# Patient Record
Sex: Male | Born: 1955 | Race: White | Hispanic: No | Smoking: Current every day smoker
Health system: Southern US, Community
[De-identification: ages and names within clinical notes are randomized; demographics above are authoritative.]

## PROBLEM LIST (undated history)

## (undated) DIAGNOSIS — I5020 Unspecified systolic (congestive) heart failure: Secondary | ICD-10-CM

## (undated) DIAGNOSIS — I10 Essential (primary) hypertension: Secondary | ICD-10-CM

## (undated) DIAGNOSIS — F17200 Nicotine dependence, unspecified, uncomplicated: Secondary | ICD-10-CM

## (undated) DIAGNOSIS — I428 Other cardiomyopathies: Secondary | ICD-10-CM

## (undated) DIAGNOSIS — R932 Abnormal findings on diagnostic imaging of liver and biliary tract: Secondary | ICD-10-CM

## (undated) DIAGNOSIS — I272 Pulmonary hypertension, unspecified: Secondary | ICD-10-CM

## (undated) HISTORY — DX: Pulmonary hypertension, unspecified: I27.20

## (undated) HISTORY — DX: Unspecified systolic (congestive) heart failure: I50.20

## (undated) HISTORY — DX: Abnormal findings on diagnostic imaging of liver and biliary tract: R93.2

## (undated) HISTORY — DX: Nicotine dependence, unspecified, uncomplicated: F17.200

## (undated) HISTORY — DX: Other cardiomyopathies: I42.8

---

## 1997-09-17 ENCOUNTER — Inpatient Hospital Stay (HOSPITAL_COMMUNITY): Admission: RE | Admit: 1997-09-17 | Discharge: 1997-09-18 | Payer: Self-pay | Admitting: Neurosurgery

## 2020-09-21 ENCOUNTER — Inpatient Hospital Stay (HOSPITAL_COMMUNITY)
Admission: EM | Admit: 2020-09-21 | Discharge: 2020-09-28 | DRG: 286 | Disposition: A | Discharge status: Home / Self Care | Payer: Self-pay | Attending: Student in an Organized Health Care Education/Training Program | Admitting: Student in an Organized Health Care Education/Training Program

## 2020-09-21 ENCOUNTER — Inpatient Hospital Stay (HOSPITAL_COMMUNITY): Payer: Self-pay

## 2020-09-21 ENCOUNTER — Other Ambulatory Visit: Payer: Self-pay

## 2020-09-21 ENCOUNTER — Encounter (HOSPITAL_COMMUNITY): Payer: Self-pay | Admitting: *Deleted

## 2020-09-21 ENCOUNTER — Emergency Department (HOSPITAL_COMMUNITY): Payer: Self-pay

## 2020-09-21 DIAGNOSIS — Z79899 Other long term (current) drug therapy: Secondary | ICD-10-CM

## 2020-09-21 DIAGNOSIS — I25118 Atherosclerotic heart disease of native coronary artery with other forms of angina pectoris: Secondary | ICD-10-CM | POA: Diagnosis present

## 2020-09-21 DIAGNOSIS — I272 Pulmonary hypertension, unspecified: Secondary | ICD-10-CM

## 2020-09-21 DIAGNOSIS — Z801 Family history of malignant neoplasm of trachea, bronchus and lung: Secondary | ICD-10-CM

## 2020-09-21 DIAGNOSIS — I428 Other cardiomyopathies: Secondary | ICD-10-CM

## 2020-09-21 DIAGNOSIS — F17210 Nicotine dependence, cigarettes, uncomplicated: Secondary | ICD-10-CM | POA: Diagnosis present

## 2020-09-21 DIAGNOSIS — J9601 Acute respiratory failure with hypoxia: Secondary | ICD-10-CM | POA: Diagnosis present

## 2020-09-21 DIAGNOSIS — I110 Hypertensive heart disease with heart failure: Principal | ICD-10-CM | POA: Diagnosis present

## 2020-09-21 DIAGNOSIS — I509 Heart failure, unspecified: Secondary | ICD-10-CM

## 2020-09-21 DIAGNOSIS — K769 Liver disease, unspecified: Secondary | ICD-10-CM | POA: Diagnosis present

## 2020-09-21 DIAGNOSIS — E8770 Fluid overload, unspecified: Secondary | ICD-10-CM

## 2020-09-21 DIAGNOSIS — F1010 Alcohol abuse, uncomplicated: Secondary | ICD-10-CM | POA: Diagnosis present

## 2020-09-21 DIAGNOSIS — Z20822 Contact with and (suspected) exposure to covid-19: Secondary | ICD-10-CM | POA: Diagnosis present

## 2020-09-21 DIAGNOSIS — R7989 Other specified abnormal findings of blood chemistry: Secondary | ICD-10-CM

## 2020-09-21 DIAGNOSIS — R2689 Other abnormalities of gait and mobility: Secondary | ICD-10-CM | POA: Diagnosis present

## 2020-09-21 DIAGNOSIS — K219 Gastro-esophageal reflux disease without esophagitis: Secondary | ICD-10-CM | POA: Diagnosis present

## 2020-09-21 DIAGNOSIS — F17200 Nicotine dependence, unspecified, uncomplicated: Secondary | ICD-10-CM | POA: Diagnosis present

## 2020-09-21 DIAGNOSIS — I5043 Acute on chronic combined systolic (congestive) and diastolic (congestive) heart failure: Secondary | ICD-10-CM | POA: Diagnosis present

## 2020-09-21 DIAGNOSIS — J810 Acute pulmonary edema: Secondary | ICD-10-CM

## 2020-09-21 DIAGNOSIS — E8809 Other disorders of plasma-protein metabolism, not elsewhere classified: Secondary | ICD-10-CM | POA: Diagnosis present

## 2020-09-21 DIAGNOSIS — E785 Hyperlipidemia, unspecified: Secondary | ICD-10-CM | POA: Diagnosis present

## 2020-09-21 DIAGNOSIS — I5023 Acute on chronic systolic (congestive) heart failure: Secondary | ICD-10-CM | POA: Diagnosis present

## 2020-09-21 DIAGNOSIS — E119 Type 2 diabetes mellitus without complications: Secondary | ICD-10-CM | POA: Diagnosis present

## 2020-09-21 DIAGNOSIS — I5022 Chronic systolic (congestive) heart failure: Secondary | ICD-10-CM | POA: Diagnosis present

## 2020-09-21 DIAGNOSIS — R748 Abnormal levels of other serum enzymes: Secondary | ICD-10-CM | POA: Diagnosis present

## 2020-09-21 DIAGNOSIS — I10 Essential (primary) hypertension: Secondary | ICD-10-CM | POA: Diagnosis present

## 2020-09-21 DIAGNOSIS — I2510 Atherosclerotic heart disease of native coronary artery without angina pectoris: Secondary | ICD-10-CM

## 2020-09-21 HISTORY — DX: Essential (primary) hypertension: I10

## 2020-09-21 LAB — PROTEIN / CREATININE RATIO, URINE
Creatinine, Urine: <10 mg/dL
Total Protein, Urine: 14 mg/dL

## 2020-09-21 LAB — COMPREHENSIVE METABOLIC PANEL
ALT: 115 U/L — ABNORMAL HIGH (ref 0–44)
AST: 78 U/L — ABNORMAL HIGH (ref 15–41)
Albumin: 3.3 g/dL — ABNORMAL LOW (ref 3.5–5.0)
Alkaline Phosphatase: 112 U/L (ref 38–126)
Anion gap: 10 (ref 5–15)
BUN: 19 mg/dL (ref 8–23)
CO2: 25 mmol/L (ref 22–32)
Calcium: 9.1 mg/dL (ref 8.9–10.3)
Chloride: 105 mmol/L (ref 98–111)
Creatinine, Ser: 1.09 mg/dL (ref 0.61–1.24)
GFR, Estimated: >60 mL/min (ref >60)
Glucose, Bld: 109 mg/dL — ABNORMAL HIGH (ref 70–99)
Potassium: 3.4 mmol/L — ABNORMAL LOW (ref 3.5–5.1)
Sodium: 140 mmol/L (ref 135–145)
Total Bilirubin: 1.6 mg/dL — ABNORMAL HIGH (ref 0.3–1.2)
Total Protein: 6.4 g/dL — ABNORMAL LOW (ref 6.5–8.1)

## 2020-09-21 LAB — LIPID PANEL
Cholesterol: 174 mg/dL (ref 0–200)
HDL: 17 mg/dL — ABNORMAL LOW (ref >40)
LDL Cholesterol: 134 mg/dL — ABNORMAL HIGH (ref 0–99)
Total CHOL/HDL Ratio: 10.2 RATIO
Triglycerides: 117 mg/dL (ref <150)
VLDL: 23 mg/dL (ref 0–40)

## 2020-09-21 LAB — CBC WITH DIFFERENTIAL/PLATELET
Abs Immature Granulocytes: 0.05 10*3/uL (ref 0.00–0.07)
Basophils Absolute: 0.1 10*3/uL (ref 0.0–0.1)
Basophils Relative: 1 %
Eosinophils Absolute: 0.1 10*3/uL (ref 0.0–0.5)
Eosinophils Relative: 1 %
HCT: 50.4 % (ref 39.0–52.0)
Hemoglobin: 15.6 g/dL (ref 13.0–17.0)
Immature Granulocytes: 1 %
Lymphocytes Relative: 22 %
Lymphs Abs: 2.3 10*3/uL (ref 0.7–4.0)
MCH: 28.8 pg (ref 26.0–34.0)
MCHC: 31 g/dL (ref 30.0–36.0)
MCV: 93.2 fL (ref 80.0–100.0)
Monocytes Absolute: 1.3 10*3/uL — ABNORMAL HIGH (ref 0.1–1.0)
Monocytes Relative: 12 %
Neutro Abs: 7 10*3/uL (ref 1.7–7.7)
Neutrophils Relative %: 63 %
Platelets: 348 10*3/uL (ref 150–400)
RBC: 5.41 MIL/uL (ref 4.22–5.81)
RDW: 15 % (ref 11.5–15.5)
WBC: 10.7 10*3/uL — ABNORMAL HIGH (ref 4.0–10.5)
nRBC: 0 % (ref 0.0–0.2)

## 2020-09-21 LAB — URINALYSIS, ROUTINE W REFLEX MICROSCOPIC
Bilirubin Urine: NEGATIVE
Glucose, UA: NEGATIVE mg/dL
Ketones, ur: 5 mg/dL — AB
Nitrite: POSITIVE — AB
Protein, ur: 100 mg/dL — AB
Specific Gravity, Urine: 1.011 (ref 1.005–1.030)
pH: 5 (ref 5.0–8.0)

## 2020-09-21 LAB — TSH: TSH: 2.713 u[IU]/mL (ref 0.350–4.500)

## 2020-09-21 LAB — TROPONIN I (HIGH SENSITIVITY)
Troponin I (High Sensitivity): 78 ng/L — ABNORMAL HIGH (ref <18)
Troponin I (High Sensitivity): 67 ng/L — ABNORMAL HIGH (ref <18)

## 2020-09-21 LAB — HEPATITIS B SURFACE ANTIGEN: Hepatitis B Surface Ag: NONREACTIVE

## 2020-09-21 LAB — PROTIME-INR
INR: 1.2 (ref 0.8–1.2)
Prothrombin Time: 15.1 seconds (ref 11.4–15.2)

## 2020-09-21 LAB — RESP PANEL BY RT-PCR (FLU A&B, COVID) ARPGX2
Influenza A by PCR: NEGATIVE
Influenza B by PCR: NEGATIVE
SARS Coronavirus 2 by RT PCR: NEGATIVE

## 2020-09-21 LAB — HEPATITIS B CORE ANTIBODY, TOTAL: Hep B Core Total Ab: NONREACTIVE

## 2020-09-21 LAB — HEPATITIS C ANTIBODY: HCV Ab: NONREACTIVE

## 2020-09-21 LAB — HIV ANTIBODY (ROUTINE TESTING W REFLEX): HIV Screen 4th Generation wRfx: NONREACTIVE

## 2020-09-21 LAB — BRAIN NATRIURETIC PEPTIDE: B Natriuretic Peptide: 1739.7 pg/mL — ABNORMAL HIGH (ref 0.0–100.0)

## 2020-09-21 MED ORDER — LOSARTAN POTASSIUM 50 MG PO TABS
50.0000 mg | ORAL_TABLET | Freq: Every day | ORAL | Status: DC
  Administered 2020-09-21 – 2020-09-25 (×5): 50 mg via ORAL
  Filled 2020-09-21 (×5): qty 1

## 2020-09-21 MED ORDER — SODIUM CHLORIDE 0.9% FLUSH
3.0000 mL | INTRAVENOUS | Status: DC | PRN
  Administered 2020-09-21: 3 mL via INTRAVENOUS

## 2020-09-21 MED ORDER — POLYETHYLENE GLYCOL 3350 17 G PO PACK: 17.0000 g | PACK | Freq: Every day | ORAL | Status: DC | PRN

## 2020-09-21 MED ORDER — ACETAMINOPHEN 325 MG PO TABS
650.0000 mg | ORAL_TABLET | ORAL | Status: DC | PRN
  Administered 2020-09-22: 650 mg via ORAL
  Filled 2020-09-21: qty 2

## 2020-09-21 MED ORDER — FUROSEMIDE 10 MG/ML IJ SOLN: 20.0000 mg | Freq: Once | INTRAMUSCULAR | Status: DC

## 2020-09-21 MED ORDER — SODIUM CHLORIDE 0.9 % IV SOLN: 250.0000 mL | INTRAVENOUS | Status: DC | PRN

## 2020-09-21 MED ORDER — SODIUM CHLORIDE 0.9% FLUSH
3.0000 mL | Freq: Two times a day (BID) | INTRAVENOUS | Status: DC
  Administered 2020-09-22 – 2020-09-24 (×6): 3 mL via INTRAVENOUS

## 2020-09-21 MED ORDER — FUROSEMIDE 10 MG/ML IJ SOLN: 40.0000 mg | Freq: Two times a day (BID) | INTRAMUSCULAR | Status: DC

## 2020-09-21 MED ORDER — NITROGLYCERIN IN D5W 200-5 MCG/ML-% IV SOLN
0.0000 ug/min | INTRAVENOUS | Status: DC
Start: 2020-09-21 — End: 2020-09-21

## 2020-09-21 MED ORDER — ENOXAPARIN SODIUM 40 MG/0.4ML IJ SOSY
40.0000 mg | PREFILLED_SYRINGE | INTRAMUSCULAR | Status: DC
  Administered 2020-09-21 – 2020-09-24 (×4): 40 mg via SUBCUTANEOUS
  Filled 2020-09-21 (×4): qty 0.4

## 2020-09-21 MED ORDER — POTASSIUM CHLORIDE 20 MEQ PO PACK
40.0000 meq | PACK | Freq: Once | ORAL | Status: AC
  Administered 2020-09-21: 40 meq via ORAL
  Filled 2020-09-21: qty 2

## 2020-09-21 MED ORDER — NITROGLYCERIN IN D5W 200-5 MCG/ML-% IV SOLN: 0.0000 ug/min | INTRAVENOUS | Status: DC

## 2020-09-21 MED ORDER — NITROGLYCERIN 2 % TD OINT
0.5000 [in_us] | TOPICAL_OINTMENT | Freq: Once | TRANSDERMAL | Status: AC
  Administered 2020-09-21: 0.5 [in_us] via TOPICAL
  Filled 2020-09-21: qty 1

## 2020-09-21 MED ORDER — ONDANSETRON HCL 4 MG/2ML IJ SOLN: 4.0000 mg | Freq: Four times a day (QID) | INTRAMUSCULAR | Status: DC | PRN

## 2020-09-21 MED ORDER — FUROSEMIDE 10 MG/ML IJ SOLN
40.0000 mg | Freq: Once | INTRAMUSCULAR | Status: AC
  Administered 2020-09-21: 40 mg via INTRAVENOUS
  Filled 2020-09-21: qty 4

## 2020-09-21 MED ORDER — POTASSIUM CHLORIDE 10 MEQ/100ML IV SOLN
10.0000 meq | INTRAVENOUS | Status: DC
  Administered 2020-09-21: 10 meq via INTRAVENOUS
  Filled 2020-09-21: qty 100

## 2020-09-21 NOTE — ED Notes (Signed)
RN called 3E, requesting approval of bed. Currently assigned 3E17. Charge RN to review chart.

## 2020-09-21 NOTE — ED Provider Notes (Signed)
[facility] EMERGENCY DEPARTMENT Provider Note   CSN: [id number] Arrival date & time: 09/21/20  1020     History Chief Complaint  Patient presents with   Shortness of Breath    Juan Kent is a 65 y.o. male.   Shortness of Breath Severity:  Moderate Onset quality:  Gradual Duration:  1 week Timing:  Constant Progression:  Worsening Chronicity:  New Context: not activity, not animal exposure, not emotional upset, not fumes, not known allergens, not occupational exposure, not pollens, not smoke exposure, not strong odors, not URI and not weather changes   Relieved by:  None tried Worsened by:  Exertion, deep breathing, movement and activity Ineffective treatments:  None tried Associated symptoms: PND   Associated symptoms: no abdominal pain, no chest pain, no claudication, no cough, no diaphoresis, no ear pain, no fever, no headaches, no hemoptysis, no neck pain, no rash, no sore throat, no sputum production, no syncope, no swollen glands, no vomiting and no wheezing   Risk factors: tobacco use   Risk factors: no recent alcohol use, no family hx of DVT, no hx of cancer, no hx of PE/DVT, no obesity, no oral contraceptive use, no prolonged immobilization and no recent surgery       Past Medical History:  Diagnosis Date   Hypertension     Patient Active Problem List   Diagnosis Date Noted   Acute heart failure (HCC) 09/21/2020     History reviewed. No pertinent surgical history.     History reviewed. No pertinent family history.  Social History   Tobacco Use   Smoking status: Every Day    Pack years: 0.00    Types: Cigarettes   Smokeless tobacco: Never  Substance Use Topics   Alcohol use: Yes   Drug use: Never    Home Medications Prior to Admission medications   Not on File    Allergies    Patient has no known allergies.  Review of Systems   Review of Systems  Constitutional:  Positive for activity change and fatigue. Negative for  chills, diaphoresis and fever.  HENT:  Negative for ear pain and sore throat.   Eyes:  Negative for pain and visual disturbance.  Respiratory:  Positive for shortness of breath. Negative for cough, hemoptysis, sputum production and wheezing.   Cardiovascular:  Positive for leg swelling and PND. Negative for chest pain, palpitations, claudication and syncope.  Gastrointestinal:  Negative for abdominal pain and vomiting.  Genitourinary:  Negative for dysuria and hematuria.  Musculoskeletal:  Negative for arthralgias, back pain and neck pain.  Skin:  Negative for color change and rash.  Neurological:  Negative for seizures, syncope and headaches.  All other systems reviewed and are negative.  Physical Exam Updated Vital Signs BP (!) 186/140   Pulse (!) 110   Temp 98.1 F (36.7 C)   Resp (!) 31   Ht 5\' 5"  (1.651 m)   Wt 81.6 kg   SpO2 94%   BMI 29.95 kg/m   Physical Exam Vitals and nursing note reviewed.  Constitutional:      Appearance: He is well-developed. He is ill-appearing.  HENT:     Head: Normocephalic and atraumatic.  Eyes:     Conjunctiva/sclera: Conjunctivae normal.  Neck:     Vascular: JVD present.  Cardiovascular:     Rate and Rhythm: Regular rhythm. Tachycardia present.     Pulses:          Radial pulses are 2+ on the right  side and 2+ on the left side.       Dorsalis pedis pulses are 1+ on the right side and 1+ on the left side.     Heart sounds: Murmur heard.  Systolic murmur is present.    Gallop present. S3 sounds present.     Comments: Pitting edema up to level of hips bilaterally. Also appreciable in his scrotum Pulmonary:     Effort: Pulmonary effort is normal. Tachypnea present. No respiratory distress.     Breath sounds: Examination of the right-middle field reveals rales. Examination of the left-middle field reveals rales. Examination of the right-lower field reveals rales. Examination of the left-lower field reveals rales. Rales present.  Abdominal:      Palpations: Abdomen is soft.     Tenderness: There is no abdominal tenderness.  Musculoskeletal:     Cervical back: Neck supple.     Right lower leg: 2+ Pitting Edema present.     Left lower leg: 2+ Pitting Edema present.  Skin:    General: Skin is warm and dry.  Neurological:     General: No focal deficit present.     Mental Status: He is alert and oriented to person, place, and time.     GCS: GCS eye subscore is 4. GCS verbal subscore is 5. GCS motor subscore is 6.     Cranial Nerves: Cranial nerves are intact.  Psychiatric:        Behavior: Behavior is cooperative.    ED Results / Procedures / Treatments   Labs (all labs ordered are listed, but only abnormal results are displayed) Labs Reviewed  CBC WITH DIFFERENTIAL/PLATELET - Abnormal; Notable for the following components:      Result Value   WBC 10.7 (*)    Monocytes Absolute 1.3 (*)    All other components within normal limits  COMPREHENSIVE METABOLIC PANEL - Abnormal; Notable for the following components:   Potassium 3.4 (*)    Glucose, Bld 109 (*)    Total Protein 6.4 (*)    Albumin 3.3 (*)    AST 78 (*)    ALT 115 (*)    Total Bilirubin 1.6 (*)    All other components within normal limits  BRAIN NATRIURETIC PEPTIDE - Abnormal; Notable for the following components:   B Natriuretic Peptide 1,739.7 (*)    All other components within normal limits  TROPONIN I (HIGH SENSITIVITY) - Abnormal; Notable for the following components:   Troponin I (High Sensitivity) 78 (*)    All other components within normal limits  TROPONIN I (HIGH SENSITIVITY) - Abnormal; Notable for the following components:   Troponin I (High Sensitivity) 67 (*)    All other components within normal limits  RESP PANEL BY RT-PCR (FLU A&B, COVID) ARPGX2  MAGNESIUM  COMPREHENSIVE METABOLIC PANEL  HIV ANTIBODY (ROUTINE TESTING W REFLEX)  TSH  PROTIME-INR  HEMOGLOBIN A1C  LIPID PANEL  URINALYSIS, ROUTINE W REFLEX MICROSCOPIC  HEPATITIS C  ANTIBODY  HEPATITIS B CORE ANTIBODY, TOTAL  HEPATITIS B SURFACE ANTIBODY, QUANTITATIVE  HEPATITIS B SURFACE ANTIGEN    EKG EKG Interpretation  Date/Time:  Monday September 21 2020 10:33:59 EDT Ventricular Rate:  106 PR Interval:  154 QRS Duration: 88 QT Interval:  364 QTC Calculation: 483 R Axis:   -67 Text Interpretation: Sinus tachycardia Left anterior fascicular block Cannot rule out Inferior infarct (masked by fascicular block?) , age undetermined Anteroseptal infarct , age undetermined Abnormal ECG Confirmed by Blane Ohara [phone]) on 09/21/2020 4:27:14 PM  Radiology DG Chest 2 View  Result Date: 09/21/2020 CLINICAL DATA:  Shortness of breath for 2 weeks. EXAM: CHEST - 2 VIEW COMPARISON:  None. FINDINGS: Cardiomegaly and mild pulmonary vascular congestion noted. Focal opacity overlying the mid LEFT lung may represent atelectasis or airspace disease. A trace LEFT pleural effusion is noted. No pneumothorax or acute bony abnormality identified. IMPRESSION: 1. Focal opacity overlying the mid LEFT lung - question atelectasis versus airspace disease/pneumonia. Radiographic follow-up to resolution is recommended. 2. Trace LEFT pleural effusion. 3. Cardiomegaly and mild pulmonary vascular congestion. Electronically Signed   By: Harmon Pier M.D.   On: 09/21/2020 11:17   US Abdomen Limited RUQ (LIVER/GB)  Result Date: 09/21/2020 CLINICAL DATA:  Elevated LFT EXAM: ULTRASOUND ABDOMEN LIMITED RIGHT UPPER QUADRANT COMPARISON:  None. FINDINGS: Gallbladder: No shadowing stones. Slight increased wall thickness at 4.3 mm. Negative sonographic Murphy. Common bile duct: Diameter: 3.6 mm Liver: Nodular hepatic contour consistent with cirrhosis. No focal hepatic abnormality. Portal vein is patent on color Doppler imaging with normal direction of blood flow towards the liver. Other: Right pleural effusion IMPRESSION: 1. Sonographic findings consistent with hepatic cirrhosis. 2. Slightly thickened gallbladder  wall, nonspecific. No shadowing stones 3. Right pleural effusion Electronically Signed   By: Jasmine Pang M.D.   On: 09/21/2020 18:49    Procedures Procedures   Medications Ordered in ED Medications  sodium chloride flush (NS) 0.9 % injection 3 mL (has no administration in time range)  sodium chloride flush (NS) 0.9 % injection 3 mL (has no administration in time range)  0.9 %  sodium chloride infusion (has no administration in time range)  acetaminophen (TYLENOL) tablet 650 mg (has no administration in time range)  ondansetron (ZOFRAN) injection 4 mg (has no administration in time range)  enoxaparin (LOVENOX) injection 40 mg (has no administration in time range)  polyethylene glycol (MIRALAX / GLYCOLAX) packet 17 g (has no administration in time range)  losartan (COZAAR) tablet 50 mg (has no administration in time range)  potassium chloride (KLOR-CON) packet 40 mEq (40 mEq Oral Given 09/21/20 1709)  furosemide (LASIX) injection 40 mg (40 mg Intravenous Given 09/21/20 1709)  nitroGLYCERIN (NITROGLYN) 2 % ointment 0.5 inch (0.5 inches Topical Given 09/21/20 1709)  furosemide (LASIX) injection 40 mg (40 mg Intravenous Given 09/21/20 1859)    ED Course  I have reviewed the triage vital signs and the nursing notes.  Pertinent labs & imaging results that were available during my care of the patient were reviewed by me and considered in my medical decision making (see chart for details).  Clinical Course as of 09/21/20 1912  Mon Sep 21, 2020  1713 Spoke with hospitalist for admission. [ZB]    Clinical Course User Index [ZB] Ardeen Fillers, DO   MDM Rules/Calculators/A&P                          Impression is pulmonary edema with evidence of hypervolemia.  Patient does not see physicians regularly and has not been formally diagnosed with congestive heart failure however this is a consideration. Likely has had underlying uncontrolled hypertension for a long period of time. He has had  intermittent pressure in his chest however none currently.  Did consider ACS. EKG as above without any clear evidence of ischemic changes however no previous to compare to.  Troponins are elevated however likely in the setting of demand ischemia.  Not hypoxic on room air.  Not in respiratory extremis.  No  indication for PPV at this time.  Plan will be to treat with vasodilators and subsequent diuresis.  He will require admission for formal echocardiogram, hypertension control.  Final Clinical Impression(s) / ED Diagnoses Final diagnoses:  LFT elevation  Hypervolemia, unspecified hypervolemia type  Acute pulmonary edema (HCC)  Acute heart failure, unspecified heart failure type [facility])    Rx / DC Orders ED Discharge Orders     None        Ardeen Fillers, DO 09/21/20 1912    Blane Ohara, MD 09/21/20 2302

## 2020-09-21 NOTE — H&P (Addendum)
Date: 09/21/2020               Patient Name:  Juan Kent MRN: [id number]  DOB: 1955/12/31 Age / Sex: 65 y.o., male   PCP: Pcp, No         Medical Service: Internal Medicine Teaching Service         Attending Physician: Dr. Erlinda Hong MD     First Contact: Thalia Bloodgood, DO Pager: Theresa Duty [phone]  Second Contact: Verdene Lennert, MD Pager: IB [phone]       After Hours (After 5p/  First Contact Pager: [phone]  weekends / holidays): Second Contact Pager: [phone]   SUBJECTIVE   Chief Complaint: Dyspnea on exertion  History of Present Illness:   [name] is a 65 y/o male with a PMHx of HTN, HLD, not currently engaged in medical therapy, tobacco use disorder and acid reflux who presents to the ED with c/o dyspnea on exertion.  [name] states that since COVID pandemic began, he slowly began to experience more and more congestion in his chest. However, in the past 2-3 weeks, he lost his "momentum and strength" around the time the heat started. He became dyspneic with short distances, in addition to increasing leg edema and leg cramping. He denies leg cramping when walking short distances prior to the last two weeks.  He does endorse waking up in middle the night feeling short of breath with palpitations. This would alleviate with deep breaths. The palpitations would be fast with intermittent pauses. He would have chest pressure with exertion; it did not radiate. Sitting down and catching his breathe would alleviate this pressure. He endorses abdominal distention that has progressed over the past several months.   He endorses chronic headaches but denies syncope, LOC, back pain, abdominal pain. No changes in appetite. He endorses a chronic cough x 1-2 years but notes it is getting worse. Cough is normally dry but lately, it has become productive. He is still able to urinate but is having severe scrotal and penile swelling. He endorses a hx of acid reflux for which he takes Pepcid. He  endorses a long term hx of HTN but does not have a PCP so he does not take medications. He previously was on Benicar (olmesartan) and Metoprolol. Hx of HLD, but statin caused severe myalgias.   ED Course: Patient presented with shortness of breath and dyspnea on exertion.  He was started with oxygen supplementation as needed.  Patient given 40 Lasix IV but has yet to urinate.  BNP of 1739.  Troponins elevated at 78, down trended to 67.  Patient also hypertensive with blood pressure of 190s-200s/150s.  Patient to be admitted to IMTS for suspicion of acute heart failure exacerbation and severe hypertension.  Meds:  Pepcid PRN  PMHx: Hypertension Hyperlipidemia Acid reflux Tobacco use disorder  PSHx: Discectomy Abdominal hernia repairs x 2  Social:  Lives With: His Mother Occupation: Works at the [employer] Level of Function: Independent in ADLs PCP: None  Substances:  Tobacco use (1-3 ppd x 30 years) Previous hx of alcohol abuse several years ago in the setting of chronic back pain (12 pack of beer and half a 750 mL bottle of liquor nightly for 3-5 years). Currently drinks 4-5 fingers of bourbon every other night.  No drug use.   Family History:  Father - Lung cancer, 46s (decreased) Denies family history of cardiac disease or strokes.  Allergies: Allergies as of 09/21/2020   (No Known Allergies)  Review of Systems: A complete ROS was negative except as per HPI.   OBJECTIVE:   Physical Exam: Blood pressure (!) 194/151, pulse (!) 119, temperature 98.1 F (36.7 C), resp. rate 18, height 5\' 5"  (1.651 m), weight 81.6 kg, SpO2 95 %.  Constitutional: Ill-appearing, no acute distress HENT: normocephalic atraumatic, mucous membranes moist Eyes: conjunctiva non-erythematous, PERRL Neck: supple Cardiovascular: tachycardic, no murmurs, gallops, rubs. No JVD appreciated. 2-3+ pitting edema lower extremities Pulmonary/Chest: normal work of breathing, 2L [place], crackles at the lung  bases bilaterally Abdominal: distended, firm, tympanitic. Tender to deep palpation in RUQ, LUQ MSK: normal bulk and tone. Clubbing of digits. GU: Penile edema Neurological: alert & oriented x 3 Skin: lower extremities are cool to touch.  No palmar erythema.  Facial telangiectasias Psych: normal mood and thought process  Labs: CBC    Component Value Date/Time   WBC 10.7 (H) 09/21/2020 1041   RBC 5.41 09/21/2020 1041   HGB 15.6 09/21/2020 1041   HCT 50.4 09/21/2020 1041   PLT 348 09/21/2020 1041   MCV 93.2 09/21/2020 1041   MCH 28.8 09/21/2020 1041   MCHC 31.0 09/21/2020 1041   RDW 15.0 09/21/2020 1041   LYMPHSABS 2.3 09/21/2020 1041   MONOABS 1.3 (H) 09/21/2020 1041   EOSABS 0.1 09/21/2020 1041   BASOSABS 0.1 09/21/2020 1041     CMP     Component Value Date/Time   NA 140 09/21/2020 1041   K 3.4 (L) 09/21/2020 1041   CL 105 09/21/2020 1041   CO2 25 09/21/2020 1041   GLUCOSE 109 (H) 09/21/2020 1041   BUN 19 09/21/2020 1041   CREATININE 1.09 09/21/2020 1041   CALCIUM 9.1 09/21/2020 1041   PROT 6.4 (L) 09/21/2020 1041   ALBUMIN 3.3 (L) 09/21/2020 1041   AST 78 (H) 09/21/2020 1041   ALT 115 (H) 09/21/2020 1041   ALKPHOS 112 09/21/2020 1041   BILITOT 1.6 (H) 09/21/2020 1041   GFRNONAA >60 09/21/2020 1041   Imaging: DG Chest 2 View  Result Date: 09/21/2020 CLINICAL DATA:  Shortness of breath for 2 weeks. EXAM: CHEST - 2 VIEW COMPARISON:  None. FINDINGS: Cardiomegaly and mild pulmonary vascular congestion noted. Focal opacity overlying the mid LEFT lung may represent atelectasis or airspace disease. A trace LEFT pleural effusion is noted. No pneumothorax or acute bony abnormality identified. IMPRESSION: 1. Focal opacity overlying the mid LEFT lung - question atelectasis versus airspace disease/pneumonia. Radiographic follow-up to resolution is recommended. 2. Trace LEFT pleural effusion. 3. Cardiomegaly and mild pulmonary vascular congestion. Electronically Signed   By:  09/23/2020 M.D.   On: 09/21/2020 11:17   09/23/2020 Abdomen Limited RUQ (LIVER/GB)  Result Date: 09/21/2020 CLINICAL DATA:  Elevated LFT EXAM: ULTRASOUND ABDOMEN LIMITED RIGHT UPPER QUADRANT COMPARISON:  None. FINDINGS: Gallbladder: No shadowing stones. Slight increased wall thickness at 4.3 mm. Negative sonographic Murphy. Common bile duct: Diameter: 3.6 mm Liver: Nodular hepatic contour consistent with cirrhosis. No focal hepatic abnormality. Portal vein is patent on color Doppler imaging with normal direction of blood flow towards the liver. Other: Right pleural effusion IMPRESSION: 1. Sonographic findings consistent with hepatic cirrhosis. 2. Slightly thickened gallbladder wall, nonspecific. No shadowing stones 3. Right pleural effusion Electronically Signed   By: 09/23/2020 M.D.   On: 09/21/2020 18:49    EKG: personally reviewed my interpretation is tachycardia, rate of 105 bpm.  Left anterior fascicular block large P waves consistent with atrial enlargement.  ASSESSMENT & PLAN:   Assessment & Plan by Problem: Active  Problems:   Acute heart failure (HCC)  Ariez Neilan is a 65 y.o. with pertinent PMH of HTN, HLD, tobacco use disorder who presented with shortness of breath, dyspnea on exertion and admitted for further work-up of suspected heart failure exacerbation hospital day 0  Anasarca Acute hypoxic respiratory failure Patient presenting with dyspnea on exertion and hypervolemia for the past 2 weeks . Swelling in his lower extremities, abdomen, scrotum. Patient requiring oxygen supplementation in the ED, 2 L nasal cannula.  BNP elevated at 1700.  No prior for comparison. Suspicious for acute heart failure exacerbation. Will need further evaluation to rule out ischemic disease, LAFB on EKG. No prior EKG for comparison.  Patient with multiple risk factors for ischemic disease; untreated hypertension and hyperlipidemia, and tobacco use disorder. Troponin's of 78, down trending to 67. Do not suspect  acute ischemic event at this time. Chest pressure while ambulating resolves with rest, consistent with stable angina. Plan to diurese patient and consult cardiology in the coming days for further ischemic work-up.  Received Lasix while in the ED but has yet to urinate, will order bladder scan. -bladder scan q8h -continue Lasix 80 mg twice daily  -echocardiogram pending -daily weights, strict I/O -daily bmp,mag, supplement electrolytes as needed -TSH pending -lipid Panel, HgbA1c pending -urinalysis to assess for proteinuria -wean oxygen as tolerable -PT/OT consult  Severe Asymptomatic Hypertension Hx of hypertension, prior medications of metoprolol and olmesartan. Patient states he has been off of his bp medications for many years.  Suspect his blood pressures been this high for quite some time. We will diurese as per above and start losartan 50 mg. With suspected new onset heart failure with possible ischemic etiology, will hold off on nitro drip. Goal BP 25% decrease. -Lasix daily as per above -Losartan 50 mg daily  Elevated Liver Enzymes Hyperbilirubinemia Abdominal Distension Patient with elevated ALT of 115 and AST of 78. T bili 1.6 and hypoalbuminemia of 3.3.  Physical exam patient with palpitations, tympanitic abdomen.  Distended abdomen has been worsening over the past few months.  Findings above are consistent with hepatic disease. Will work-up further with PT/INR and right upper quadrant ultrasound.  Patient is high risk for hepatic disease with history of alcohol use.  If decompensated liver disease, may be contributing to his anasarca as well. -RUQ [place] pending -PT/INR pending -Hepatitis B/C, HIV pending  Palpitations With nightly episodes of palpitations and shortness of breath.  Describes palpitations as multiple beats and then long pauses.  We will continue telemetry during his admission.  With his body habitus, patient is at risk for OSA which can cause the symptoms and is  concerning for possible ischemic disease. -Cardiac monitoring -Cardiology consult tomorrow  Tobacco Use Disorder Patient with extensive cigarette smoking history.  Denies needing nicotine patch while admitted.  Patient with chronic cough worsening over the past 2 years, with history of tobacco use will need outpatient work-up for COPD.  Diet: Heart Healthy VTE: Enoxaparin IVF: None,None Code: Full  Prior to Admission Living Arrangement: Home, living with mother Anticipated Discharge Location: Home Barriers to Discharge: Further evaluation and workup of his shortness of breath  Dispo: Admit patient to Inpatient with expected length of stay greater than 2 midnights.  Signed: Belva Agee, MD Internal Medicine Resident PGY-1 Pager: [phone]  09/21/2020, 7:20 PM

## 2020-09-21 NOTE — ED Triage Notes (Signed)
Pt reports having generalized weakness, fatigue and sob x 2 weeks. Has swelling to his legs and left side back pain. Denies chest pain. Hypertensive at triage, reports hx of same but does not take medications.

## 2020-09-21 NOTE — ED Notes (Addendum)
Concerns for elevated BP and DBP discussed with Dr. Huel Cote, Elbert Ewings MD. Per MD pt started on losartan and NTG patch. NO new orders received at this time.

## 2020-09-21 NOTE — ED Notes (Addendum)
Pt educated regarding fluid restriction. Informed of 2000 mL fluid limit.

## 2020-09-21 NOTE — ED Provider Notes (Signed)
Emergency Medicine Provider Triage Evaluation Note  Juan Kent , Kent 65 y.o. male  was evaluated in triage.  Pt complains of CP, SOB x 1-2 weeks. Sob with any exhertion.  Gets SOB when he moves.  He feels generally weak and fatigued.  He does not take anything for his blood pressure.  Feels like he has some swelling to his bilateral lower legs. Sleeps in recliner due to chronic back pain. No numbness,  unilateral weakness  No hx of DVT, PE  Does not follow  with PCP     Review of Systems  Positive: CP, SOB, LE edema Negative: numbness  Physical Exam  BP (!) 220/145 (BP Location: Right Arm)   Pulse (!) 107   Temp 98.1 F (36.7 C)   Resp 17   SpO2 90%  Gen:   Awake, no distress   Resp:  Normal effort  MSK:   Moves extremities without difficulty, Trace pitting edema to BLE Other:    Medical Decision Making  Medically screening exam initiated at 10:40 AM.  Appropriate orders placed.  Juan Kent was informed that the remainder of the evaluation will be completed by another provider, this initial triage assessment does not replace that evaluation, and the  CP, SOB, LE edema   Juan Hum A, PA-C 09/21/20 1042    Juan Barrette, MD 09/30/20 [phone]

## 2020-09-22 ENCOUNTER — Inpatient Hospital Stay (HOSPITAL_COMMUNITY): Payer: Self-pay

## 2020-09-22 ENCOUNTER — Encounter (HOSPITAL_COMMUNITY): Payer: Self-pay | Admitting: Student in an Organized Health Care Education/Training Program

## 2020-09-22 DIAGNOSIS — I10 Essential (primary) hypertension: Secondary | ICD-10-CM | POA: Diagnosis present

## 2020-09-22 DIAGNOSIS — I5023 Acute on chronic systolic (congestive) heart failure: Secondary | ICD-10-CM

## 2020-09-22 DIAGNOSIS — F17200 Nicotine dependence, unspecified, uncomplicated: Secondary | ICD-10-CM | POA: Diagnosis present

## 2020-09-22 DIAGNOSIS — K769 Liver disease, unspecified: Secondary | ICD-10-CM | POA: Diagnosis present

## 2020-09-22 DIAGNOSIS — F1010 Alcohol abuse, uncomplicated: Secondary | ICD-10-CM | POA: Diagnosis present

## 2020-09-22 DIAGNOSIS — I5020 Unspecified systolic (congestive) heart failure: Secondary | ICD-10-CM

## 2020-09-22 LAB — COMPREHENSIVE METABOLIC PANEL
ALT: 106 U/L — ABNORMAL HIGH (ref 0–44)
AST: 67 U/L — ABNORMAL HIGH (ref 15–41)
Albumin: 3.1 g/dL — ABNORMAL LOW (ref 3.5–5.0)
Alkaline Phosphatase: 112 U/L (ref 38–126)
Anion gap: 9 (ref 5–15)
BUN: 18 mg/dL (ref 8–23)
CO2: 26 mmol/L (ref 22–32)
Calcium: 8.9 mg/dL (ref 8.9–10.3)
Chloride: 105 mmol/L (ref 98–111)
Creatinine, Ser: 1.07 mg/dL (ref 0.61–1.24)
GFR, Estimated: >60 mL/min (ref >60)
Glucose, Bld: 150 mg/dL — ABNORMAL HIGH (ref 70–99)
Potassium: 3.1 mmol/L — ABNORMAL LOW (ref 3.5–5.1)
Sodium: 140 mmol/L (ref 135–145)
Total Bilirubin: 1.7 mg/dL — ABNORMAL HIGH (ref 0.3–1.2)
Total Protein: 6 g/dL — ABNORMAL LOW (ref 6.5–8.1)

## 2020-09-22 LAB — MAGNESIUM: Magnesium: 1.8 mg/dL (ref 1.7–2.4)

## 2020-09-22 LAB — ECHOCARDIOGRAM COMPLETE
Area-P 1/2: 4.66 cm2
Calc EF: 11 %
Height: 66 in
S' Lateral: 5 cm
Single Plane A2C EF: 17.6 %
Single Plane A4C EF: 7.1 %
Weight: 2847.99 oz

## 2020-09-22 LAB — HEMOGLOBIN A1C
Hgb A1c MFr Bld: 6.7 % — ABNORMAL HIGH (ref 4.8–5.6)
Mean Plasma Glucose: 146 mg/dL

## 2020-09-22 LAB — HEPATITIS B SURFACE ANTIBODY, QUANTITATIVE: Hep B S AB Quant (Post): <3.1 m[IU]/mL — ABNORMAL LOW (ref >9.9)

## 2020-09-22 MED ORDER — CYCLOBENZAPRINE HCL 5 MG PO TABS
5.0000 mg | ORAL_TABLET | Freq: Once | ORAL | Status: AC
  Administered 2020-09-22: 5 mg via ORAL
  Filled 2020-09-22: qty 1

## 2020-09-22 MED ORDER — FUROSEMIDE 10 MG/ML IJ SOLN
80.0000 mg | Freq: Two times a day (BID) | INTRAMUSCULAR | Status: DC
  Administered 2020-09-22 (×2): 80 mg via INTRAVENOUS
  Filled 2020-09-22 (×3): qty 8

## 2020-09-22 MED ORDER — ATORVASTATIN CALCIUM 40 MG PO TABS
40.0000 mg | ORAL_TABLET | Freq: Every day | ORAL | Status: DC
  Administered 2020-09-23 – 2020-09-28 (×6): 40 mg via ORAL
  Filled 2020-09-22 (×6): qty 1

## 2020-09-22 MED ORDER — POTASSIUM CHLORIDE CRYS ER 20 MEQ PO TBCR
40.0000 meq | EXTENDED_RELEASE_TABLET | Freq: Two times a day (BID) | ORAL | Status: DC
  Administered 2020-09-22: 40 meq via ORAL
  Filled 2020-09-22: qty 2

## 2020-09-22 MED ORDER — MAGNESIUM SULFATE 2 GM/50ML IV SOLN
2.0000 g | Freq: Once | INTRAVENOUS | Status: AC
  Administered 2020-09-22: 2 g via INTRAVENOUS
  Filled 2020-09-22: qty 50

## 2020-09-22 MED ORDER — SPIRONOLACTONE 12.5 MG HALF TABLET
12.5000 mg | ORAL_TABLET | Freq: Every day | ORAL | Status: DC
  Administered 2020-09-22 – 2020-09-28 (×7): 12.5 mg via ORAL
  Filled 2020-09-22 (×7): qty 1

## 2020-09-22 MED ORDER — ASPIRIN 81 MG PO CHEW
81.0000 mg | CHEWABLE_TABLET | Freq: Every day | ORAL | Status: DC
  Administered 2020-09-22 – 2020-09-24 (×3): 81 mg via ORAL
  Filled 2020-09-22 (×3): qty 1

## 2020-09-22 MED ORDER — HYDRALAZINE HCL 20 MG/ML IJ SOLN
5.0000 mg | INTRAMUSCULAR | Status: DC | PRN
  Administered 2020-09-22: 5 mg via INTRAVENOUS
  Filled 2020-09-22: qty 1

## 2020-09-22 MED ORDER — PERFLUTREN LIPID MICROSPHERE
1.0000 mL | INTRAVENOUS | Status: AC | PRN
Start: 2020-09-22 — End: 2020-09-22
  Administered 2020-09-22: 2 mL via INTRAVENOUS
  Filled 2020-09-22: qty 10

## 2020-09-22 MED ORDER — POTASSIUM CHLORIDE 20 MEQ PO PACK
40.0000 meq | PACK | Freq: Two times a day (BID) | ORAL | Status: DC
  Administered 2020-09-22 – 2020-09-26 (×9): 40 meq via ORAL
  Filled 2020-09-22 (×10): qty 2

## 2020-09-22 MED ORDER — POTASSIUM CHLORIDE 20 MEQ PO PACK
40.0000 meq | PACK | Freq: Two times a day (BID) | ORAL | Status: DC
  Administered 2020-09-22: 40 meq via ORAL
  Filled 2020-09-22: qty 2

## 2020-09-22 NOTE — Progress Notes (Signed)
OT Cancellation Note  Patient Details Name: Juan Kent MRN: [id number] DOB: [birth year]   Cancelled Treatment:    Reason Eval/Treat Not Completed: Patient at procedure or test/ unavailable Echo currently being performed at bedside. Will follow-up again for OT eval time allows.   Lorre Munroe 09/22/2020, 8:11 AM

## 2020-09-22 NOTE — Progress Notes (Addendum)
HD#1 Subjective:  Overnight Events: Admitted yesterday evening  Patient sitting upright in bed, states he feels better today trying some breakfast.  He notes that his breathing is up and down but he feels comfortable at this time.  He does have shortness of breath at rest.  Denies being able to ambulate around the room.  Does endorse some left lower back discomfort, he believes that he pulled a muscle  Discussed with patient findings of new acute heart failure.  Informed him that we would be prescribing medications to medically optimize his heart failure and decreased progression.  Patient notes that in the past it has been difficult to afford medications as he has not had medical insurance.  Does note that he will be obtaining Medicare in a year.  Denies needing a nicotine patch at this time  Objective:  Vital signs in last 24 hours: Vitals:   09/22/20 0252 09/22/20 0418 09/22/20 0500 09/22/20 0751  BP: (!) 180/116 (!) 161/108  (!) 145/114  Pulse: (!) 107 (!) 102  88  Resp: 19 19  18   Temp: 98 F (36.7 C) 97.6 F (36.4 C)    TempSrc: Oral Oral    SpO2: 98% 99%  100%  Weight:   80.7 kg   Height:       Supplemental O2: Nasal Cannula SpO2: 100 % O2 Flow Rate (L/min): 3 L/min   Physical Exam:  Constitutional: well-appearing, resting in bed comfortably HENT: normocephalic atraumatic Eyes: conjunctiva non-erythematous Neck: supple Cardiovascular: regular rate and rhythm, no m/r/g.  JVD 4 cm above sternal angle.  2+ pitting edema lower extremities Pulmonary/Chest: normal work of breathing on room air, bilateral crackles at lung bases. Abdominal: Firm, distended.  Tympanitic Neurological: alert & oriented x 3 Skin: warm and dry Psych: Normal mood and thought process  Filed Weights   09/21/20 1609 09/22/20 0015 09/22/20 0500  Weight: 81.6 kg 80.7 kg 80.7 kg     Intake/Output Summary (Last 24 hours) at 09/22/2020 0807 Last data filed at 09/22/2020 0100 Gross per 24 hour   Intake 460 ml  Output 2000 ml  Net -1540 ml   Net IO Since Admission: -1,540 mL [09/22/20 0807]  Pertinent Labs: CBC Latest Ref Rng & Units 09/21/2020  WBC 4.0 - 10.5 K/uL 10.7(H)  Hemoglobin 13.0 - 17.0 g/dL 09/23/2020  Hematocrit 01.7 - 52.0 % 50.4  Platelets 150 - 400 K/uL 348    CMP Latest Ref Rng & Units 09/22/2020 09/21/2020  Glucose 70 - 99 mg/dL 09/23/2020) 496(P)  BUN 8 - 23 mg/dL 18 19  Creatinine 591(M - 1.24 mg/dL 3.84 6.65  Sodium 9.93 - 145 mmol/L 140 140  Potassium 3.5 - 5.1 mmol/L 3.1(L) 3.4(L)  Chloride 98 - 111 mmol/L 105 105  CO2 22 - 32 mmol/L 26 25  Calcium 8.9 - 10.3 mg/dL 8.9 9.1  Total Protein 6.5 - 8.1 g/dL 6.0(L) 6.4(L)  Total Bilirubin 0.3 - 1.2 mg/dL 570) 1.7(B)  Alkaline Phos 38 - 126 U/L 112 112  AST 15 - 41 U/L 67(H) 78(H)  ALT 0 - 44 U/L 106(H) 115(H)    Imaging: DG Chest 2 View  Result Date: 09/21/2020 CLINICAL DATA:  Shortness of breath for 2 weeks. EXAM: CHEST - 2 VIEW COMPARISON:  None. FINDINGS: Cardiomegaly and mild pulmonary vascular congestion noted. Focal opacity overlying the mid LEFT lung may represent atelectasis or airspace disease. A trace LEFT pleural effusion is noted. No pneumothorax or acute bony abnormality identified. IMPRESSION: 1. Focal opacity  overlying the mid LEFT lung - question atelectasis versus airspace disease/pneumonia. Radiographic follow-up to resolution is recommended. 2. Trace LEFT pleural effusion. 3. Cardiomegaly and mild pulmonary vascular congestion. Electronically Signed   By: Harmon Pier M.D.   On: 09/21/2020 11:17   US Abdomen Limited RUQ (LIVER/GB)  Result Date: 09/21/2020 CLINICAL DATA:  Elevated LFT EXAM: ULTRASOUND ABDOMEN LIMITED RIGHT UPPER QUADRANT COMPARISON:  None. FINDINGS: Gallbladder: No shadowing stones. Slight increased wall thickness at 4.3 mm. Negative sonographic Murphy. Common bile duct: Diameter: 3.6 mm Liver: Nodular hepatic contour consistent with cirrhosis. No focal hepatic abnormality. Portal  vein is patent on color Doppler imaging with normal direction of blood flow towards the liver. Other: Right pleural effusion IMPRESSION: 1. Sonographic findings consistent with hepatic cirrhosis. 2. Slightly thickened gallbladder wall, nonspecific. No shadowing stones 3. Right pleural effusion Electronically Signed   By: Jasmine Pang M.D.   On: 09/21/2020 18:49    Echocardiogram 09/22/20   1. Left ventricular ejection fraction, by estimation, is 20 to 25%. The  left ventricle has severely decreased function. The left ventricle  demonstrates global hypokinesis. The left ventricular internal cavity size  was mildly dilated. There is mild left  ventricular hypertrophy. Left ventricular diastolic parameters are  consistent with Grade III diastolic dysfunction (restrictive). Elevated  left atrial pressure.   2. Right ventricular systolic function is mildly reduced. The right  ventricular size is normal. There is moderately elevated pulmonary artery  systolic pressure.   3. Left atrial size was moderately dilated.   4. Right atrial size was moderately dilated.   5. The mitral valve is normal in structure. Mild to moderate mitral valve  regurgitation. No evidence of mitral stenosis.   6. Tricuspid valve regurgitation is moderate.   7. The aortic valve is tricuspid. Aortic valve regurgitation is trivial.  No aortic stenosis is present.   8. The inferior vena cava is dilated in size with <50% respiratory  variability, suggesting right atrial pressure of 15 mmHg.   Assessment/Plan:   Active Problems:   Acute heart failure [facility])   Patient Summary: Juan Kent is a 65 y.o. with a pertinent PMH of PMH of HTN, HLD, tobacco use disorder, who presented with dyspnea on exertion and anasarca and admitted for further evaluation of acute heart failure exacerbation  Acute HFrEF Exacerbation  Initially presented with anasarca and acute hypoxic respiratory failure.  Echocardiogram with pertinent findings  of left ventricular ejection fraction of 20 to 25%.  LV with severely decreased function with global hypokinesis.  Mild LVH.  Diastolic parameters consistent with grade 3 diastolic dysfunction.  Mildly reduced right ventricular systolic function.  Patient with improvement of his symptoms after receiving IV Lasix overnight.  Suspicious for ischemic etiology.  Plan to start aspirin and atorvastatin today.  Possible etiologies are patient's alcohol use as well as chronic hypertension.  We will plan to start spironolactone 12.5 mg today and continue losartan 50 mg.  Because of the severity of his ejection fraction, will consult cardiology today to determine timeline for ischemic work-up. -Cardiology consult in place, appreciate their recommendations and assistance -Lasix 80 mg twice daily -Losartan 50 mg daily, start spironolactone 12.5 daily -Start 81 mg aspirin and Lipitor 40 mg daily -Daily weight/I&O's, pulmonary consult in place.  His nightly palpitations bladder scans -Daily BMP and mag levels, supplement as needed -Hemoglobin A1c pending -wean oxygen as tolerable -PT/OT consulted, no recommendations  Severe Asymptomatic Hypertension Blood pressure has improved overnight, this morning blood pressure of 161/108.  Current BP of 143/104.  We will continue losartan and Lasix as per above.  Adding spironolactone today. -Lasix daily as per above -Losartan 50 mg, spironolactone 12.5 daily   Elevated Liver Enzymes Hyperbilirubinemia Abdominal Distension Patient with elevated ALT of 115 and AST of 78. T bili 1.6 and hypoalbuminemia of 3.3.  Abdominal distention has improved.  PT/INR normal.  Hepatitis B/C and HIV antibody negative.  Right upper ultrasound with findings consistent with hepatic cirrhosis, no ascites noted on radiologist impression.  These findings are consistent with chronic liver disease, patient has no signs of decompensation at this time.  He will need to follow-up on outpatient basis  to monitor the symptoms make certain that there is no progression to decompensated cirrhosis. -Follow-up outpatient basis -Continue to encourage alcohol cessation   Palpitations Patient denies any episodes of palpitations overnight.  His episodes of periodic nightly palpitations and shortness of breath possibly due to his suspected ischemic disease.  Plan as per above, cardiology consult in place.   Tobacco Use Disorder Patient with extensive cigarette smoking history.  Denies needing nicotine patch.  Continue smoking cessation during his hospitalization.  Will be important if patient's etiology is ischemic in nature continue to discuss alternative in therapies for tobacco use disorder.   Diet: Heart Healthy IVF: None,None VTE: Enoxaparin Code: Full PT/OT recs: None, none. TOC recs: Patient has PCP needs, but will follow up with [facility]  Dispo: Anticipated discharge to Home in 3 to 4 days pending further work-up of his new diagnosis of heart failure.  Thalia Bloodgood DO Internal Medicine Resident PGY-1 Pager [phone] Please contact the on call pager after 5 pm and on weekends at [phone].

## 2020-09-22 NOTE — Progress Notes (Signed)
   09/22/20 1100  Clinical Encounter Type  Visited With Patient  Visit Type Initial  Referral From Nurse  Consult/Referral To Chaplain  Spiritual Encounters  Spiritual Needs Literature  The chaplain responded to spiritual consult for an advance directive. The chaplain spoke with the patient and provided education. The patient is widowed and has no children. His mother is his primary contact, and he would like to make decisions on his behalf. He did not complete the paperwork. His mother would be the contact person the hospital would speak with, and she is in his listed as the alternate contact.

## 2020-09-22 NOTE — Progress Notes (Signed)
Patient evaluated at bedside for lower extremity cramping. Endorsed cramping of legs and right hand.  Diuresed 4L per nursing staff. Will order 2 g mag IV and continue PO potassium supplementation.  Updated patient on echocardiogram findings of ejection fraction of 20-25%. Discussed that we will be consulting cardiology for further workup. Patient had no questions or concerns at this time.

## 2020-09-22 NOTE — Consult Note (Addendum)
Cardiology Consultation:   Patient ID: Juan Kent MRN: [id number]; DOB: 11-21-1955  Admit date: 09/21/2020 Date of Consult: 09/22/2020  PCP:  Oneita HurtPcp, No   [facility] Providers Cardiologist:  Parke PoissonGayatri A Lizette Pazos, MD       New  Patient Profile:   Juan Kent is a 65 y.o. male with a hx of untreated HTN, tobacco use, HLD, GERD presented with acute SOB and weakness who is being seen 09/22/2020 for the evaluation of CHF and cardiomyopathy at the request of Dr. Oswaldo DoneVincent.  History of Present Illness:   Juan Kent with no recent medical care, presented to ER 09/21/20 with SOB, while this has been present for maybe 2 years over last 2-3 weeks he became weak, dyspnea with short distances.  He wakes in middle of night with dyspnea and palpitations. He has had chest pressure with exertion and some on admit.  Has significant lower ext edema including scrotal and penile. Which has improved.  BP on arrival 220/145 ST at 107  had IV hydralazine.  Now BP somewhat improved.    In ER BNP 1,739,  Hs troponin 78, 67  WBC 10.7, Hgb 15.6 plts 348  Na 140, K+ 3.4 (this AM 3.1) BUN 19, Cr 1.09 albumin 3.3, AST 78 (today 67) , ALT 115 (today 106), TP 6.4 T bilirubin 1.6  COVID neg.    EKG:  The EKG was personally reviewed and demonstrates:  ST at 106, LAFB, Q wave in inf leads and poor R wave progression. No old to compare.  Telemetry:  Telemetry was personally reviewed and demonstrates:  SR to ST  Tchol 174, HDL 17, LDL 134  A1C 6.7 TSH 2.713 +UTI Abd U/S IMPRESSION: 1. Sonographic findings consistent with hepatic cirrhosis. 2. Slightly thickened gallbladder wall, nonspecific. No shadowing stones 3. Right pleural effusion  2V CXR    IMPRESSION: 1. Focal opacity overlying the mid LEFT lung - question atelectasis versus airspace disease/pneumonia. Radiographic follow-up to resolution is recommended. 2. Trace LEFT pleural effusion. 3. Cardiomegaly and mild pulmonary vascular congestion.   Echo today  EF 20-25%, global hypokinesis, mild LVH, G3DD RV systolic function mildly reduced.  Moderately elevated pulmonary artery systolic pressure.  RA and LA moderately dilated, mild to mod MVR, TR is moderate   Has rec'd lasix 80 yesterday evening and now 80 BID.  He is neg 5,120 since admit.  Wt down from 81.6 kg to 80.7 Kg.  Mg+ 1.8 and replaced. K+ replacing.   BP 143/104 P 87 to 106 R 20 to 27 afebrile Has rec'd 2 vaccine injections for covid and one booster   Past Medical History:  Diagnosis Date   Hypertension     History reviewed. No pertinent surgical history.   Home Medications:  Prior to Admission medications   Not on File    Inpatient Medications: Scheduled Meds:  aspirin  81 mg Oral Daily   [START ON 09/23/2020] atorvastatin  40 mg Oral Daily   enoxaparin (LOVENOX) injection  40 mg Subcutaneous Q24H   furosemide  80 mg Intravenous BID   losartan  50 mg Oral Daily   potassium chloride  40 mEq Oral BID   sodium chloride flush  3 mL Intravenous Q12H   spironolactone  12.5 mg Oral Daily   Continuous Infusions:  sodium chloride     PRN Meds: sodium chloride, acetaminophen, ondansetron (ZOFRAN) IV, polyethylene glycol, sodium chloride flush  Allergies:   No Known Allergies  Social History:   Social History   Socioeconomic History  Marital status: Married    Spouse name: Not on file   Number of children: Not on file   Years of education: Not on file   Highest education level: Not on file  Occupational History   Not on file  Tobacco Use   Smoking status: Every Day    Pack years: 0.00    Types: Cigarettes   Smokeless tobacco: Never  Substance and Sexual Activity   Alcohol use: Yes   Drug use: Never   Sexual activity: Not on file  Other Topics Concern   Not on file  Social History Narrative   Not on file   Social Determinants of Health   Financial Resource Strain: Not on file  Food Insecurity: Not on file  Transportation Needs: Not on file  Physical  Activity: Not on file  Stress: Not on file  Social Connections: Not on file  Intimate Partner Violence: Not on file    Family History:    Family History  Problem Relation Age of Onset   Cancer Father      ROS:  Please see the history of present illness.  General:no colds or fevers, no weight changes- thinks he had covid last year and has had cough until recently. Skin:no rashes or ulcers HEENT:no blurred vision, no congestion CV:see HPI PUL:see HPI GI:no diarrhea constipation or melena, no indigestion GU:no hematuria, no dysuria MS:no joint pain, no claudication Neuro:no syncope, no lightheadedness Endo:new diabetes, no thyroid disease  All other ROS reviewed and negative.     Physical Exam/Data:   Vitals:   09/22/20 0500 09/22/20 0751 09/22/20 1002 09/22/20 1149  BP:  (!) 145/114  (!) 143/104  Pulse:  88  90  Resp:  18  20  Temp:  98.7 F (37.1 C)  98.7 F (37.1 C)  TempSrc:  Oral  Oral  SpO2:  100% 90% 95%  Weight: 80.7 kg     Height:        Intake/Output Summary (Last 24 hours) at 09/22/2020 1536 Last data filed at 09/22/2020 1347 Gross per 24 hour  Intake 580 ml  Output 5700 ml  Net -5120 ml   Last 3 Weights 09/22/2020 09/22/2020 09/21/2020  Weight (lbs) 178 lb 178 lb 180 lb  Weight (kg) 80.74 kg 80.74 kg 81.647 kg     Body mass index is 28.73 kg/m.  General:  Well nourished, well developed, in no acute distress HEENT: normal Lymph: no adenopathy Neck: no JVD Endocrine:  No thryomegaly Vascular: No carotid bruits; pedal pulses 2+ bilaterally   Cardiac:  normal S1, S2; RRR; no murmur gallup rub or click Lungs:  diminished with few rales to auscultation bilaterally, no wheezing, rhonchi  Abd: soft, nontender, no hepatomegaly  Ext: some edema in hips and back, and feet, lower ext much improved per pt Musculoskeletal:  No deformities, BUE and BLE strength normal and equal Skin: warm and dry  Neuro:  alert and oriented X 3 MAE follows commands, no focal  abnormalities noted Psych:  Normal affect  Relevant CV Studies: Echo 09/22/20  IMPRESSIONS     1. Left ventricular ejection fraction, by estimation, is 20 to 25%. The  left ventricle has severely decreased function. The left ventricle  demonstrates global hypokinesis. The left ventricular internal cavity size  was mildly dilated. There is mild left  ventricular hypertrophy. Left ventricular diastolic parameters are  consistent with Grade III diastolic dysfunction (restrictive). Elevated  left atrial pressure.   2. Right ventricular systolic function is mildly reduced.  The right  ventricular size is normal. There is moderately elevated pulmonary artery  systolic pressure.   3. Left atrial size was moderately dilated.   4. Right atrial size was moderately dilated.   5. The mitral valve is normal in structure. Mild to moderate mitral valve  regurgitation. No evidence of mitral stenosis.   6. Tricuspid valve regurgitation is moderate.   7. The aortic valve is tricuspid. Aortic valve regurgitation is trivial.  No aortic stenosis is present.   8. The inferior vena cava is dilated in size with <50% respiratory  variability, suggesting right atrial pressure of 15 mmHg.   FINDINGS   Left Ventricle: Left ventricular ejection fraction, by estimation, is 20  to 25%. The left ventricle has severely decreased function. The left  ventricle demonstrates global hypokinesis. Definity contrast agent was  given IV to delineate the left  ventricular endocardial borders. The left ventricular internal cavity size  was mildly dilated. There is mild left ventricular hypertrophy. Left  ventricular diastolic parameters are consistent with Grade III diastolic  dysfunction (restrictive). Elevated  left atrial pressure.   Right Ventricle: The right ventricular size is normal. Right ventricular  systolic function is mildly reduced. There is moderately elevated  pulmonary artery systolic pressure. The  tricuspid regurgitant velocity is  2.84 m/s, and with an assumed right  atrial pressure of 15 mmHg, the estimated right ventricular systolic  pressure is 47.3 mmHg.   Left Atrium: Left atrial size was moderately dilated.   Right Atrium: Right atrial size was moderately dilated.   Pericardium: There is no evidence of pericardial effusion.   Mitral Valve: The mitral valve is normal in structure. Mild mitral annular  calcification. Mild to moderate mitral valve regurgitation. No evidence of  mitral valve stenosis.   Tricuspid Valve: The tricuspid valve is normal in structure. Tricuspid  valve regurgitation is moderate . No evidence of tricuspid stenosis.   Aortic Valve: The aortic valve is tricuspid. Aortic valve regurgitation is  trivial. No aortic stenosis is present.   Pulmonic Valve: The pulmonic valve was normal in structure. Pulmonic valve  regurgitation is not visualized. No evidence of pulmonic stenosis.   Aorta: The aortic root is normal in size and structure.  Laboratory Data:  High Sensitivity Troponin:   Recent Labs  Lab 09/21/20 1041 09/21/20 1241  TROPONINIHS 78* 67*     Chemistry Recent Labs  Lab 09/21/20 1041 09/22/20 0309  NA 140 140  K 3.4* 3.1*  CL 105 105  CO2 25 26  GLUCOSE 109* 150*  BUN 19 18  CREATININE 1.09 1.07  CALCIUM 9.1 8.9  GFRNONAA >60 >60  ANIONGAP 10 9    Recent Labs  Lab 09/21/20 1041 09/22/20 0309  PROT 6.4* 6.0*  ALBUMIN 3.3* 3.1*  AST 78* 67*  ALT 115* 106*  ALKPHOS 112 112  BILITOT 1.6* 1.7*   Hematology Recent Labs  Lab 09/21/20 1041  WBC 10.7*  RBC 5.41  HGB 15.6  HCT 50.4  MCV 93.2  MCH 28.8  MCHC 31.0  RDW 15.0  PLT 348   BNP Recent Labs  Lab 09/21/20 1041  BNP 1,739.7*    DDimer No results for input(s): DDIMER in the last 168 hours.   Radiology/Studies:  DG Chest 2 View  Result Date: 09/21/2020 CLINICAL DATA:  Shortness of breath for 2 weeks. EXAM: CHEST - 2 VIEW COMPARISON:  None.  FINDINGS: Cardiomegaly and mild pulmonary vascular congestion noted. Focal opacity overlying the mid LEFT lung may represent  atelectasis or airspace disease. A trace LEFT pleural effusion is noted. No pneumothorax or acute bony abnormality identified. IMPRESSION: 1. Focal opacity overlying the mid LEFT lung - question atelectasis versus airspace disease/pneumonia. Radiographic follow-up to resolution is recommended. 2. Trace LEFT pleural effusion. 3. Cardiomegaly and mild pulmonary vascular congestion. Electronically Signed   By: Harmon Pier M.D.   On: 09/21/2020 11:17   ECHOCARDIOGRAM COMPLETE  Result Date: 09/22/2020    ECHOCARDIOGRAM REPORT   Patient Name:   Juan Kent Date of Exam: 09/22/2020 Medical Rec #:  [id number]  Height:       66.0 in Accession #:    [id number] Weight:       178.0 lb Date of Birth:  [birth year]  BSA:          1.903 m Patient Age:    65 years   BP:           145/114 mmHg Patient Gender: M          HR:           90 bpm. Exam Location:  Inpatient Procedure: 2D Echo, 3D Echo, Cardiac Doppler, Color Doppler and Intracardiac            Opacification Agent STAT ECHO Indications:    I50.40* Unspecified combined systolic (congestive) and diastolic                 (congestive) heart failure  History:        Patient has no prior history of Echocardiogram examinations.                 Risk Factors:Hypertension.  Sonographer:    Sheralyn Boatman RDCS Referring Phys: [id number] JOSHUA ZAVITZ IMPRESSIONS  1. Left ventricular ejection fraction, by estimation, is 20 to 25%. The left ventricle has severely decreased function. The left ventricle demonstrates global hypokinesis. The left ventricular internal cavity size was mildly dilated. There is mild left ventricular hypertrophy. Left ventricular diastolic parameters are consistent with Grade III diastolic dysfunction (restrictive). Elevated left atrial pressure.  2. Right ventricular systolic function is mildly reduced. The right ventricular size is normal. There  is moderately elevated pulmonary artery systolic pressure.  3. Left atrial size was moderately dilated.  4. Right atrial size was moderately dilated.  5. The mitral valve is normal in structure. Mild to moderate mitral valve regurgitation. No evidence of mitral stenosis.  6. Tricuspid valve regurgitation is moderate.  7. The aortic valve is tricuspid. Aortic valve regurgitation is trivial. No aortic stenosis is present.  8. The inferior vena cava is dilated in size with <50% respiratory variability, suggesting right atrial pressure of 15 mmHg. FINDINGS  Left Ventricle: Left ventricular ejection fraction, by estimation, is 20 to 25%. The left ventricle has severely decreased function. The left ventricle demonstrates global hypokinesis. Definity contrast agent was given IV to delineate the left ventricular endocardial borders. The left ventricular internal cavity size was mildly dilated. There is mild left ventricular hypertrophy. Left ventricular diastolic parameters are consistent with Grade III diastolic dysfunction (restrictive). Elevated left atrial pressure. Right Ventricle: The right ventricular size is normal. Right ventricular systolic function is mildly reduced. There is moderately elevated pulmonary artery systolic pressure. The tricuspid regurgitant velocity is 2.84 m/s, and with an assumed right atrial pressure of 15 mmHg, the estimated right ventricular systolic pressure is 47.3 mmHg. Left Atrium: Left atrial size was moderately dilated. Right Atrium: Right atrial size was moderately dilated. Pericardium: There is no evidence of pericardial effusion. Mitral  Valve: The mitral valve is normal in structure. Mild mitral annular calcification. Mild to moderate mitral valve regurgitation. No evidence of mitral valve stenosis. Tricuspid Valve: The tricuspid valve is normal in structure. Tricuspid valve regurgitation is moderate . No evidence of tricuspid stenosis. Aortic Valve: The aortic valve is tricuspid.  Aortic valve regurgitation is trivial. No aortic stenosis is present. Pulmonic Valve: The pulmonic valve was normal in structure. Pulmonic valve regurgitation is not visualized. No evidence of pulmonic stenosis. Aorta: The aortic root is normal in size and structure. Venous: The inferior vena cava is dilated in size with less than 50% respiratory variability, suggesting right atrial pressure of 15 mmHg. IAS/Shunts: No atrial level shunt detected by color flow Doppler.  LEFT VENTRICLE PLAX 2D LVIDd:         5.50 cm      Diastology LVIDs:         5.00 cm      LV e' medial:    4.16 cm/s LV PW:         1.30 cm      LV E/e' medial:  21.2 LV IVS:        1.20 cm      LV e' lateral:   6.02 cm/s LVOT diam:     1.80 cm      LV E/e' lateral: 14.7 LV SV:         24 LV SV Index:   13 LVOT Area:     2.54 cm  LV Volumes (MOD) LV vol d, MOD A2C: 159.0 ml LV vol d, MOD A4C: 106.0 ml LV vol s, MOD A2C: 131.0 ml LV vol s, MOD A4C: 98.5 ml LV SV MOD A2C:     28.0 ml LV SV MOD A4C:     106.0 ml LV SV MOD BP:      14.4 ml RIGHT VENTRICLE            IVC RV S prime:     6.75 cm/s  IVC diam: 2.40 cm TAPSE (M-mode): 0.8 cm LEFT ATRIUM             Index       RIGHT ATRIUM           Index LA diam:        4.60 cm 2.42 cm/m  RA Area:     22.90 cm LA Vol (A2C):   48.3 ml 25.38 ml/m RA Volume:   74.80 ml  39.30 ml/m LA Vol (A4C):   50.0 ml 26.27 ml/m LA Biplane Vol: 51.6 ml 27.11 ml/m  AORTIC VALVE LVOT Vmax:   76.30 cm/s LVOT Vmean:  43.700 cm/s LVOT VTI:    0.094 m  AORTA Ao Root diam: 3.60 cm Ao Asc diam:  3.70 cm MITRAL VALVE               TRICUSPID VALVE MV Area (PHT): 4.66 cm    TR Peak grad:   32.3 mmHg MV Decel Time: 163 msec    TR Vmax:        284.00 cm/s MV E velocity: 88.38 cm/s                            SHUNTS                            Systemic VTI:  0.09 m  Systemic Diam: 1.80 cm Olga Millers MD Electronically signed by Olga Millers MD Signature Date/Time: 09/22/2020/12:06:18 PM    Final    US  Abdomen Limited RUQ (LIVER/GB)  Result Date: 09/21/2020 CLINICAL DATA:  Elevated LFT EXAM: ULTRASOUND ABDOMEN LIMITED RIGHT UPPER QUADRANT COMPARISON:  None. FINDINGS: Gallbladder: No shadowing stones. Slight increased wall thickness at 4.3 mm. Negative sonographic Murphy. Common bile duct: Diameter: 3.6 mm Liver: Nodular hepatic contour consistent with cirrhosis. No focal hepatic abnormality. Portal vein is patent on color Doppler imaging with normal direction of blood flow towards the liver. Other: Right pleural effusion IMPRESSION: 1. Sonographic findings consistent with hepatic cirrhosis. 2. Slightly thickened gallbladder wall, nonspecific. No shadowing stones 3. Right pleural effusion Electronically Signed   By: Jasmine Pang M.D.   On: 09/21/2020 18:49     Assessment and Plan:   Acute on chronic combined systolic and diastolic CHF and with RHF as well.  Hypoxia wit RA sp02 of 90%. Diuresing with lasix 80 IV BID.   Once edema improved would most likely do Rt and Lt cardiac cath with angina on arrival.  Mild elevation of hs troponin.  Troponin could be due to HTN and stress of CHF.  But high risk factors.  Uncontrolled HTN for unknown length of time.  Control with goal <130/85 Cardiomyopathy may be non ischemic but with risk factors would do cath.  May be due to burned out diastolic dysfunction.  But no on aldactone, lasix and cozaar.  Would add coreg once he has diuresed. Begin low dose. At some point change to entresto if he can afford.  MR and TR due to CM. May improve with treatment. Elevated LFTs and hepatic cirrhosis on abd U/S but fluid may be playing a role as well.  Monitor LFTs. Continue to diuresis.  Tobacco use, recommend stopping. He was 2-3 PPD now 1PPD and he agrees need to stop. Palpitations here SR monitor.  May need 30 day monitor as outpt   Risk Assessment/Risk Scores:        New York Heart Association (NYHA) Functional Class NYHA Class IV        For questions or  updates, please contact [facility] Please consult [email or web address] for contact info under    Signed, Nada Boozer, NP  09/22/2020 3:36 PM  ---------------------------------------------------------------------------------------------   History and all data above reviewed.  Patient examined.  I agree with the findings as above.  Juan Kent is a 65 year old gentleman who presents with decompensated heart failure and newly recognized systolic dysfunction and moderately reduced RV function with at least moderate MR and TR.  He has never had an ischemic work-up and has no known cardiovascular history.  He notes that for at least the past 2 weeks he has been filling up with fluid but feels he may have even been short of breath since the beginning of the pandemic when he feels he may have had COVID.  He had noted abdominal and scrotal swelling which prompted his presentation to the ED.  There are anteroseptal Q waves on his ECG of unknown duration.  He denies a significant episode of chest pain or chest pain over the past several weeks.  His primary concern has been shortness of breath and weakness with increasing swelling.  Constitutional: No acute distress Cardiovascular: regular rhythm, normal rate, no murmurs. S1 and S2 normal. Radial pulses normal bilaterally. jugular venous distention to the mid one third of the neck at 45 degrees Respiratory: Crackles bilaterally in the bases. GI :  normal bowel sounds, soft and nontender.  Distention, but not tense.   MSK: extremities warm, well perfused.  1-2+ lower extremity edema.  NEURO: grossly nonfocal exam, moves all extremities. PSYCH: alert and oriented x 3, normal mood and affect.   All available labs, radiology testing, previous records reviewed. Agree with documented assessment and plan of my colleague as stated above with the following additions or changes:  Principal Problem:   Acute on chronic HFrEF (heart failure with reduced ejection  fraction) (HCC) Active Problems:   Tobacco use disorder   Essential hypertension   Chronic liver disease   Alcohol use disorder    Plan:  The patient has newly diagnosed severe systolic dysfunction with moderate RV dysfunction and likely resultant valvular regurgitation.  He has risk factors for CAD including heavy smoking and hyperlipidemia as well as newly diagnosed diabetes and severe hypertension.  His cardiomyopathy may be secondary to hypertension but we will need to rule out ischemia first.  Would recommend that he is diuresed to euvolemia and then pursue right and left heart catheterization.  If no obstructive CAD is noted, could consider further evaluation of cardiomyopathy at that time.    Agree with initiation of heart failure therapy, patient has been started on an ARB and MRA, could consider transition to [facility] if affordable.  Consider SGLT2i initiation for heart failure and management of diabetes.  Beta-blocker when better compensated.  Carvedilol may be a good choice for him given alpha component for hypertension treatment.  Patient is having significant cramping which he thinks could be due to statin initiation versus Lasix.  I suspect with the significant diuresis he is experiencing these cramps are likely due to diuresis, however if they continue after diuresis slows, could consider holding his statin since he has had statin intolerance in the past.  Could consider reducing the dose of Lasix since he is otherwise Lasix nave and renal function has been stable if diuresis continues to produce bothersome cramping.  Cardiology will follow along.  Length of Stay:  LOS: 1 day   Parke Poisson, MD [facility] 4:36 PM  09/22/2020

## 2020-09-22 NOTE — Evaluation (Addendum)
Occupational Therapy Evaluation Patient Details Name: Juan Kent MRN: [id number] DOB: Nov 07, 1955 Today's Date: 09/22/2020    History of Present Illness Juan Kent is a 65 y/o male who presents to the ED with c/o dyspnea on exertion with palpitations and increasing leg edema/cramping. Pt admitted due to suspicion of acute heart failure exacerbation and severe hypertension. PMH: HTN, HLD, not currently engaged in medical therapy, tobacco use disorder and acid reflux   Clinical Impression   PTA, pt lives with mother and reports Independence with ADLs, IADLs and mobility without AD. Pt works in Production designer, theatre/television/film for [employer]. Pt does endorse increasing difficulty with daily tasks though improving in hospital with decreasing swelling and SOB. Pt reports this is first time mobilizing, so opted to use RW for mobility but able to progress to no AD with no LOB or physical assist needed. Pt overall Independent for UB ADLs and no more than Setup Assist for LB ADLs. Educated on energy conservation and strategies to prevent HR exacerbation (handouts provided). Will continue to follow acutely to progress endurance and reinforce strategy carryover though no OT follow-up recommended at DC.  BP 140/93 SpO2 95% on RA with activity     Follow Up Recommendations  No OT follow up    Equipment Recommendations  None recommended by OT    Recommendations for Other Services       Precautions / Restrictions Precautions Precautions: Fall Precaution Comments: low fall Restrictions Weight Bearing Restrictions: No      Mobility Bed Mobility Overal bed mobility: Modified Independent             General bed mobility comments: HOB elevated    Transfers Overall transfer level: Independent Equipment used: Rolling walker (2 wheeled);None             General transfer comment: able to complete sit to stand with RW and without AD, no LOB.    Balance Overall balance assessment: No apparent balance deficits  (not formally assessed)                                         ADL either performed or assessed with clinical judgement   ADL Overall ADL's : Needs assistance/impaired Eating/Feeding: Independent;Sitting   Grooming: Independent;Standing   Upper Body Bathing: Independent;Sitting   Lower Body Bathing: Set up;Sit to/from stand   Upper Body Dressing : Independent;Sitting   Lower Body Dressing: Set up;Sit to/from stand   Toilet Transfer: Set up;Ambulation;Regular Teacher, adult education Details (indicate cue type and reason): with and without RW. able to mobilize without RW safely from bathroom Toileting- Clothing Manipulation and Hygiene: Modified independent;Sit to/from stand Toileting - Clothing Manipulation Details (indicate cue type and reason): no assist needed       General ADL Comments: Pt with improving SOB/endurance though still below baseline. O2 WFL on RA. Provided CHF & Self care handout with emphasis on daily weights, monitoring fluid intake, as well as enegy conservation strategies as needed     Vision Baseline Vision/History: No visual deficits Patient Visual Report: No change from baseline Vision Assessment?: No apparent visual deficits     Perception     Praxis      Pertinent Vitals/Pain Pain Assessment: No/denies pain     Hand Dominance Right   Extremity/Trunk Assessment Upper Extremity Assessment Upper Extremity Assessment: Overall WFL for tasks assessed   Lower Extremity Assessment Lower Extremity Assessment: Defer to  PT evaluation   Cervical / Trunk Assessment Cervical / Trunk Assessment: Normal   Communication Communication Communication: No difficulties   Cognition Arousal/Alertness: Awake/alert Behavior During Therapy: WFL for tasks assessed/performed Overall Cognitive Status: Within Functional Limits for tasks assessed                                 General Comments: Decreased health literacy for CHF.  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 Indepedent with ADLs/IADLs        OT Problem List: Decreased activity tolerance;Cardiopulmonary status limiting activity      OT Treatment/Interventions: Self-care/ADL training;Therapeutic exercise;Energy conservation;DME and/or AE instruction;Therapeutic activities;Patient/family education;Balance training    OT Goals(Current goals can be found in the care plan section) Acute Rehab OT Goals Patient Stated Goal: return to work OT Goal Formulation: With patient Time For Goal Achievement: 10/06/20 Potential to Achieve Goals: Good ADL Goals Pt Will Transfer to Toilet: Independently;ambulating Pt Will Perform Tub/Shower Transfer: Tub transfer;Independently;ambulating Additional ADL Goal #1: Pt to verbalize at least 3 strategies to implement in daily routine to prevent CHF exacerbation Additional ADL Goal #2: Pt to increase standing tolerance > 7 min during functional tasks with stable vitals  OT Frequency: Min 2X/week   Barriers to D/C:            Co-evaluation              AM-PAC OT "6 Clicks" Daily Activity     Outcome Measure Help from another person eating meals?: None Help from another person taking care of personal grooming?: None Help from another person toileting, which includes using toliet, bedpan, or  urinal?: A Little Help from another person bathing (including washing, rinsing, drying)?: A Little Help from another person to put on and taking off regular upper body clothing?: None Help from another person to put on and taking off regular lower body clothing?: A Little 6 Click Score: 21   End of Session Equipment Utilized During Treatment: Rolling walker;Oxygen Nurse Communication: Mobility status;Other (comment) (BP, urine output w/ urinal)  Activity Tolerance: Patient tolerated treatment well Patient left: in bed;with call bell/phone within reach  OT Visit Diagnosis: Other (comment) (decreased cardiopulmonary tolerance)                Time: 9390-3009 OT Time Calculation (min): 22 min Charges:  OT General Charges $OT Visit: 1 Visit OT Evaluation $OT Eval Low Complexity: 1 Low  Bradd Canary, OTR/L Acute Rehab Services Office: [phone]   Lorre Munroe 09/22/2020, 9:57 AM

## 2020-09-22 NOTE — Progress Notes (Signed)
12:30 AM RN reported patient's blood pressure 189/142 with slight headache.  When evaluated bedside, patient appears in no acute respiratory distress.  He reports frontal headache, rated 5/10.  Denies any change in vision or focal neurological deficits.  Repeat blood pressure 184/130. Pulse 106. 99% on 4L.  Heart auscultation is tachycardic, regular rhythm.  Patient was admitted for anasarca likely secondary to an acute heart failure.  Patient has not been taking any medications for a long time.  Losartan 50 mg was started.  He also has received 80 mg of Lasix and produced 2 L of urine.   Goal to reduce 10-15% of blood pressure.  Avoid rapid correction to reduce risk of intracranial hypoperfusion.  Will give 650 mg of Tylenol for headache and dim the light to allow rest. Will recheck BP in 30 minute. If remains elevated, will start IV Hydralazine.

## 2020-09-22 NOTE — Progress Notes (Signed)
  Echocardiogram 2D Echocardiogram has been performed.  Janalyn Harder 09/22/2020, 9:43 AM

## 2020-09-22 NOTE — Evaluation (Signed)
Physical Therapy Evaluation Patient Details Name: Juan Kent MRN: [id number] DOB: Apr 28, 1955 Today's Date: 09/22/2020   History of Present Illness  Juan Kent is a 65 y/o male admitted 09/21/20 with c/o DOE, palpitations, increasing leg edema/cramping. Workup for acute heart failure exacerbation and severe HTN. PMH: HTN, HLD, not currently engaged in medical therapy, tobacco use disorder and acid reflux.   Clinical Impression  Pt presents with an overall decrease in functional mobility secondary to above. PTA, pt independent without DME, working, drives, lives with mother. Today, pt able to ambulate and perform ADLs without DME; pt limited by decreased activity tolerance with DOE 3-4/4. Initiated educ re: activity recommendations, energy conservation, and importance of mobility. Pt would benefit from continued acute PT services to maximize functional mobility and independence prior to d/c home.     Follow Up Recommendations No PT follow up    Equipment Recommendations  None recommended by PT    Recommendations for Other Services       Precautions / Restrictions Precautions Precautions: Other (comment) Precaution Comments: Watch SpO2, HR Restrictions Weight Bearing Restrictions: No      Mobility  Bed Mobility Overal bed mobility: Modified Independent             General bed mobility comments: HOB slightly elevated    Transfers Overall transfer level: Independent Equipment used: None             General transfer comment: able to complete sit to stand with RW and without AD, no LOB.  Ambulation/Gait Ambulation/Gait assistance: Supervision Gait Distance (Feet): 150 Feet Assistive device: None Gait Pattern/deviations: Step-through pattern;Decreased stride length Gait velocity: Decreased   General Gait Details: Slow, fatigued gait without DME, supervision for activity pacing and cues for pursed lip breathing  Stairs Stairs: Yes Stairs assistance:  Supervision Stair Management: One rail Left;Alternating pattern;Step to pattern;Forwards Number of Stairs: 5 General stair comments: Ascend 5 steps alternating pattern, descend 5 steps with step-to pattern; single UE support on rail; DOE 3-4/4 requiring standing rest break; supervision for activity pacing  Wheelchair Mobility    Modified Rankin (Stroke Patients Only)       Balance Overall balance assessment: No apparent balance deficits (not formally assessed)                           High level balance activites: Side stepping;Backward walking;Direction changes;Turns;Sudden stops;Head turns High Level Balance Comments: No overt instability or LOB with higher level balance tasks             Pertinent Vitals/Pain Pain Assessment: Faces Faces Pain Scale: Hurts a little bit Pain Location: Bilatera feet, bilateral hands, left side abdomen Pain Descriptors / Indicators: Cramping Pain Intervention(s): Monitored during session (RN aware)    Home Living Family/patient expects to be discharged to:: Private residence Living Arrangements: Parent Available Help at Discharge: Family;Available PRN/intermittently Type of Home: Mobile home Home Access: Stairs to enter Entrance Stairs-Rails: Right;Left Entrance Stairs-Number of Steps: 5 Home Layout: One level Home Equipment: None Additional Comments: mother works    Prior Function Level of Independence: Independent         Comments: 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 Indepedent with ADLs/IADLs     Hand Dominance   Dominant Hand: Right    Extremity/Trunk Assessment   Upper Extremity Assessment Upper Extremity Assessment: Overall WFL for tasks assessed    Lower Extremity Assessment Lower Extremity Assessment: Overall [facility]  for tasks assessed    Cervical / Trunk Assessment Cervical / Trunk Assessment: Normal  Communication   Communication: No difficulties  Cognition  Arousal/Alertness: Awake/alert Behavior During Therapy: WFL for tasks assessed/performed Overall Cognitive Status: Within Functional Limits for tasks assessed                                 General Comments: Decreased health literacy for CHF. Cognitively [facility]      General Comments General comments (skin integrity, edema, etc.): SpO2 94-99% on RA with activity, HR 90s-120s, BP 143/104. Initiated educ re: activity with HF (shorter bouts of ambulation/activity more frequently), energy conserv, pursed lip breahting    Exercises     Assessment/Plan    PT Assessment Patient needs continued PT services  PT Problem List Decreased activity tolerance;Decreased mobility;Cardiopulmonary status limiting activity       PT Treatment Interventions Gait training;Stair training;Functional mobility training;Therapeutic activities;Therapeutic exercise;Patient/family education    PT Goals (Current goals can be found in the Care Plan section)  Acute Rehab PT Goals Patient Stated Goal: return to work PT Goal Formulation: With patient Time For Goal Achievement: 10/06/20 Potential to Achieve Goals: Good    Frequency Min 3X/week   Barriers to discharge        Co-evaluation               AM-PAC PT "6 Clicks" Mobility  Outcome Measure Help needed turning from your back to your side while in a flat bed without using bedrails?: None Help needed moving from lying on your back to sitting on the side of a flat bed without using bedrails?: None Help needed moving to and from a bed to a chair (including a wheelchair)?: None Help needed standing up from a chair using your arms (e.g., wheelchair or bedside chair)?: None Help needed to walk in hospital room?: A Little Help needed climbing 3-5 steps with a railing? : A Little 6 Click Score: 22    End of Session   Activity Tolerance: Patient tolerated treatment well;Patient limited by fatigue Patient left: in bed;with call bell/phone  within reach Nurse Communication: Mobility status PT Visit Diagnosis: Other abnormalities of gait and mobility (R26.89)    Time: 6948-5462 PT Time Calculation (min) (ACUTE ONLY): 22 min   Charges:   PT Evaluation $PT Eval Low Complexity: 1 Low     Ina Homes, PT, DPT Acute Rehabilitation Services  Pager [phone] Office [phone]  Malachy Chamber 09/22/2020, 12:47 PM

## 2020-09-23 DIAGNOSIS — I5023 Acute on chronic systolic (congestive) heart failure: Secondary | ICD-10-CM

## 2020-09-23 LAB — COMPREHENSIVE METABOLIC PANEL
ALT: 77 U/L — ABNORMAL HIGH (ref 0–44)
AST: 43 U/L — ABNORMAL HIGH (ref 15–41)
Albumin: 3 g/dL — ABNORMAL LOW (ref 3.5–5.0)
Alkaline Phosphatase: 97 U/L (ref 38–126)
Anion gap: 10 (ref 5–15)
BUN: 21 mg/dL (ref 8–23)
CO2: 29 mmol/L (ref 22–32)
Calcium: 9.4 mg/dL (ref 8.9–10.3)
Chloride: 102 mmol/L (ref 98–111)
Creatinine, Ser: 1.26 mg/dL — ABNORMAL HIGH (ref 0.61–1.24)
GFR, Estimated: >60 mL/min (ref >60)
Glucose, Bld: 119 mg/dL — ABNORMAL HIGH (ref 70–99)
Potassium: 3.4 mmol/L — ABNORMAL LOW (ref 3.5–5.1)
Sodium: 141 mmol/L (ref 135–145)
Total Bilirubin: 1.2 mg/dL (ref 0.3–1.2)
Total Protein: 5.7 g/dL — ABNORMAL LOW (ref 6.5–8.1)

## 2020-09-23 LAB — MAGNESIUM: Magnesium: 2.2 mg/dL (ref 1.7–2.4)

## 2020-09-23 MED ORDER — CYCLOBENZAPRINE HCL 5 MG PO TABS
7.5000 mg | ORAL_TABLET | Freq: Three times a day (TID) | ORAL | Status: DC | PRN
  Administered 2020-09-26 (×2): 7.5 mg via ORAL
  Filled 2020-09-23 (×2): qty 2

## 2020-09-23 MED ORDER — FUROSEMIDE 10 MG/ML IJ SOLN: 40.0000 mg | Freq: Two times a day (BID) | INTRAMUSCULAR | Status: DC

## 2020-09-23 MED ORDER — CARVEDILOL 6.25 MG PO TABS
6.2500 mg | ORAL_TABLET | Freq: Two times a day (BID) | ORAL | Status: DC
  Administered 2020-09-23 – 2020-09-28 (×10): 6.25 mg via ORAL
  Filled 2020-09-23 (×12): qty 1

## 2020-09-23 MED ORDER — FUROSEMIDE 10 MG/ML IJ SOLN
40.0000 mg | Freq: Two times a day (BID) | INTRAMUSCULAR | Status: DC
  Administered 2020-09-23 – 2020-09-24 (×4): 40 mg via INTRAVENOUS
  Filled 2020-09-23 (×4): qty 4

## 2020-09-23 NOTE — Progress Notes (Signed)
Physical Therapy Treatment & Discharge Patient Details Name: Juan Kent MRN: [id number] DOB: [birth year] Today's Date: 09/23/2020    History of Present Illness Juan Kent is a 65 y/o male admitted 09/21/20 with c/o DOE, palpitations, increasing leg edema/cramping. Workup for acute heart failure exacerbation and severe HTN. PMH: HTN, HLD, not currently engaged in medical therapy, tobacco use disorder and acid reflux   PT Comments    Pt progressing with mobility. Independent with ADL and mobility tasks, demonstrates improved activity tolerance with less DOE compared to yesterday's session. Reviewed educ and encouraged more frequent hallway ambulation while admitted. Pt has met short-term acute PT goals; reports no further questions or concerns. Will d/c acute PT.    Follow Up Recommendations  No PT follow up     Equipment Recommendations  None recommended by PT    Recommendations for Other Services       Precautions / Restrictions Precautions Precautions: None Restrictions Weight Bearing Restrictions: No    Mobility  Bed Mobility Overal bed mobility: Independent                  Transfers Overall transfer level: Independent Equipment used: None                Ambulation/Gait Ambulation/Gait assistance: Independent Gait Distance (Feet): 480 Feet Assistive device: None Gait Pattern/deviations: WFL(Within Functional Limits)   Gait velocity interpretation: >2.62 ft/sec, indicative of community ambulatory General Gait Details: Steady gait indep without DME; reports distance limited by LE fatigue   Stairs Stairs:  (pt declined secondary to LE fatigue)           Wheelchair Mobility    Modified Rankin (Stroke Patients Only)       Balance Overall balance assessment: No apparent balance deficits (not formally assessed)                           High level balance activites: Side stepping;Backward walking;Direction changes;Turns;Sudden  stops;Head turns High Level Balance Comments: No overt instability or LOB with higher level balance tasks            Cognition Arousal/Alertness: Awake/alert Behavior During Therapy: WFL for tasks assessed/performed Overall Cognitive Status: Within Functional Limits for tasks assessed                                        Exercises      General Comments General comments (skin integrity, edema, etc.): Post-ambulation HR 101, BP 156/108. Reviewed educ re: activity recommendations, importance of mobility, energy conservation (handout provided)      Pertinent Vitals/Pain Pain Assessment: Faces Faces Pain Scale: Hurts a little bit Pain Location: bilateral calves Pain Descriptors / Indicators: Cramping Pain Intervention(s): Monitored during session    Home Living                      Prior Function            PT Goals (current goals can now be found in the care plan section) Progress towards PT goals: Goals met/education completed, patient discharged from PT    Frequency    Min 3X/week      PT Plan Current plan remains appropriate    Co-evaluation              AM-PAC PT "6 Clicks" Mobility   Outcome Measure  Help needed  turning from your back to your side while in a flat bed without using bedrails?: None Help needed moving from lying on your back to sitting on the side of a flat bed without using bedrails?: None Help needed moving to and from a bed to a chair (including a wheelchair)?: None Help needed standing up from a chair using your arms (e.g., wheelchair or bedside chair)?: None Help needed to walk in hospital room?: None Help needed climbing 3-5 steps with a railing? : None 6 Click Score: 24    End of Session   Activity Tolerance: Patient tolerated treatment well Patient left: in bed;with call bell/phone within reach;with family/visitor present Nurse Communication: Mobility status PT Visit Diagnosis: Other abnormalities  of gait and mobility (R26.89)     Time: 2505-3976 PT Time Calculation (min) (ACUTE ONLY): 13 min  Charges:  $Therapeutic Exercise: 8-22 mins                     Mabeline Caras, PT, DPT Acute Rehabilitation Services  Pager [phone] Office [place] 09/23/2020, 11:57 AM

## 2020-09-23 NOTE — Progress Notes (Signed)
Heart Failure Stewardship Pharmacist Progress Note   PCP: Pcp, No PCP-Cardiologist: Parke Poisson, MD    HPI:  65 yo M w/ PMH of untreated HTN, tobacco use, HLD, and GERD who presented 6/20 with acute SOB and weakness x2-3 weeks. Wakes up in middle of night with dyspnea and palpitations, noted significant LEE. Hypertensive urgency upon arrival BP 220/145, resolved with IV hydralazine. ECHO revealed EF 20-25%  Current HF Medications: - Carvedilol 6.25mg  BID - Furosemide 40mg  IV BID - Losartan 50mg  daily - Spironolactone 12.5mg  daily - Potassium BID  Prior to admission HF Medications: - None  Pertinent Lab Values: Serum creatinine 1.26 (up from 1.07 yesterday), BUN 21, Potassium 3.4, Sodium 141, BNP 1739, Magnesium 2.2 A1c 6.7  Vital Signs: Weight: 169 lbs (admission weight: 180 lbs) Blood pressure: 165/110  Heart rate: 80s   Medication Assistance / Insurance Benefits Check: Does the patient have prescription insurance?  No  Does the patient qualify for medication assistance through manufacturers or grants?   Yes Eligible grants and/or patient assistance programs: Pending Medication assistance applications in progress: N/A  Medication assistance applications approved: N/A Approved medication assistance renewals will be completed by: Pending  Outpatient Pharmacy:  Prior to admission outpatient pharmacy: [facility] Is the patient willing to use [facility] T[facility] pharmacy at discharge? Pending Is the patient willing to transition their outpatient pharmacy to utilize a [facility] outpatient pharmacy?   Pending    Assessment: 1. Acute on chronic systolic CHF (EF [facility]), likely due to NICM (uncontrolled HTN). NYHA class III symptoms. - Continue current medications - Scr bump, likely due to initiation of losartan. Continue to monitor, hold off on adding Entresto or SGLT2 today until Scr stabilizes  Plan: 1) Medication changes recommended at this time: - Continue current  regimen  2) Patient assistance: - If starting Entresto or SGLT2, can obtain patient assistance  3)  Education  - To be completed prior to discharge - HF TOC f/u 6/29  23-76%) Aujanae Mccullum, PharmD Student

## 2020-09-23 NOTE — Progress Notes (Addendum)
HD#2 Subjective:  Overnight Events: Continues to have cramps  [name] is sitting up in bed resting comfortably.  Notes that he has been urinating frequently.  He continues to have cramps in his lower extremities as well as hands.  States that the Flexeril he was given last night helped but he feels as though it was not enough.  He is still unable to sleep flat but feels that his breathing is improved.  Discussed with him that we will continue to give him potassium and magnesium supplementation as needed.  Plan will be to diurese him and perform a heart catheterization for further evaluation.  Patient knowledges understanding of this.  We also discussed his tobacco use.  Patient states he quit smoking in the past using Chantix but he was unfortunately unable to afford the medication and began smoking cigarettes again.  Objective:  Vital signs in last 24 hours: Vitals:   09/23/20 0355 09/23/20 0450 09/23/20 0737 09/23/20 0800  BP: (!) 171/118   (!) 165/110  Pulse:   89   Resp: 18   20  Temp: 98.4 F (36.9 C)  97.6 F (36.4 C)   TempSrc: Oral  Oral   SpO2: 97%   97%  Weight:  76.8 kg    Height:       Supplemental O2: Nasal Cannula SpO2: 97 % O2 Flow Rate (L/min): 2 L/min   Physical Exam:  Constitutional: well-appearing, resting in bed comfortably HENT: normocephalic atraumatic Eyes: conjunctiva non-erythematous Neck: supple Cardiovascular: regular rate and rhythm, no m/r/g.  JVD at the base of the neck. 1+ edema lower extremities Pulmonary/Chest: normal work of breathing on room air, crackles bilateral lung bases Abdominal: Distended, soft Neurological: alert & oriented x 3 Skin: warm and dry Psych: Normal mood and thought process  Filed Weights   09/22/20 0500 09/23/20 0032 09/23/20 0450  Weight: 80.7 kg 76.8 kg 76.8 kg     Intake/Output Summary (Last 24 hours) at 09/23/2020 1002 Last data filed at 09/23/2020 0700 Gross per 24 hour  Intake 1610 ml  Output 7900 ml   Net -6290 ml   Net IO Since Admission: -8,160 mL [09/23/20 1002]  Pertinent Labs: CBC Latest Ref Rng & Units 09/21/2020  WBC 4.0 - 10.5 K/uL 10.7(H)  Hemoglobin 13.0 - 17.0 g/dL 29.5  Hematocrit 28.4 - 52.0 % 50.4  Platelets 150 - 400 K/uL 348    CMP Latest Ref Rng & Units 09/23/2020 09/22/2020 09/21/2020  Glucose 70 - 99 mg/dL 132(G) 401(U) 272(Z)  BUN 8 - 23 mg/dL 21 18 19   Creatinine 0.61 - 1.24 mg/dL ) 3.66(Y 4.03  Sodium 135 - 145 mmol/L 141 140 140  Potassium 3.5 - 5.1 mmol/L 3.4(L) 3.1(L) 3.4(L)  Chloride 98 - 111 mmol/L 102 105 105  CO2 22 - 32 mmol/L 29 26 25   Calcium 8.9 - 10.3 mg/dL 9.4 8.9 9.1  Total Protein 6.5 - 8.1 g/dL 4.74) 6.0(L) 6.4(L)  Total Bilirubin 0.3 - 1.2 mg/dL 1.2 ) 2.5(Z)  Alkaline Phos 38 - 126 U/L 97 112 112  AST 15 - 41 U/L 43(H) 67(H) 78(H)  ALT 0 - 44 U/L 77(H) 106(H) 115(H)    Imaging: No results found.  Echocardiogram 09/22/20   1. Left ventricular ejection fraction, by estimation, is 20 to 25%. The  left ventricle has severely decreased function. The left ventricle  demonstrates global hypokinesis. The left ventricular internal cavity size  was mildly dilated. There is mild left  ventricular hypertrophy. Left ventricular  diastolic parameters are  consistent with Grade III diastolic dysfunction (restrictive). Elevated  left atrial pressure.   2. Right ventricular systolic function is mildly reduced. The right  ventricular size is normal. There is moderately elevated pulmonary artery  systolic pressure.   3. Left atrial size was moderately dilated.   4. Right atrial size was moderately dilated.   5. The mitral valve is normal in structure. Mild to moderate mitral valve  regurgitation. No evidence of mitral stenosis.   6. Tricuspid valve regurgitation is moderate.   7. The aortic valve is tricuspid. Aortic valve regurgitation is trivial.  No aortic stenosis is present.   8. The inferior vena cava is dilated in size with <50%  respiratory  variability, suggesting right atrial pressure of 15 mmHg.   Assessment/Plan:   Principal Problem:   Acute on chronic HFrEF (heart failure with reduced ejection fraction) (HCC) Active Problems:   Tobacco use disorder   Essential hypertension   Chronic liver disease   Alcohol use disorder   Patient Summary: Juan Kent is a 65 y.o. with a pertinent PMH of PMH of HTN, HLD, tobacco use disorder, who presented with dyspnea on exertion and anasarca and admitted for further evaluation of acute heart failure exacerbation  Acute on chronic HFrEF Exacerbation  Initially presented with anasarca and acute hypoxic respiratory failure.  Echocardiogram with pertinent findings of left ventricular ejection fraction of 20 to 25%.  Patient continues to diurese well, net -8.1L I/O. Weight is down 3.9 kg.  Symptomatically improving, no longer hypoxic at rest.  Extremities are warm, do not suspect low output state today.  Overall patient is improving well.  He is having extremity cramping that I suspect is secondary to his significant diureses.  He has yet to receive his first dose of statin.  Will decrease lasix to 40 mg BID, continue losartan 50 mg and Spironolactone 12.5 daily.  We will add carvedilol 6.25 mg, patient appears euvolemic on my exam and with persistent tachycardia feel as though he could benefit from this addition. Cardiology consulted, recommending right and left heart cath once patient diuresed.  Suspect he will be ready for cath tomorrow or the day after. If no obstructive coronary disease will need further evaluation for his cardiomyopathy.  Plan to add SGLT2 high and Entresto at discharge, affordability may be an issue as patient is without insurance. -Cardiology consult in place, appreciate their recommendations and assistance -Decrease Lasix to 40 mg twice daily -Losartan 50 mg, spironolactone 12.5 daily -Carvedilol 6.25 mg daily -Continue 81 mg aspirin and Lipitor 40 mg daily.   If cramps with Lipitor can consider Crestor. -We anticipate that ARNI and SGLT2 Inhibitor will be unaffordable for now, but a possibility next year when he gets Medicare. -Daily weight/I&O's -Daily BMP and mag levels, continue Klor-Con 40 mg daily -wean oxygen as tolerable -PT/OT consulted, no recommendations  Severe Asymptomatic Hypertension Blood pressure continues to be elevated with losartan, spironolactone, and lasix. Will add carvedilol 6.25 mg today.  Hold off on increasing losartan or spironolactone with increase in creatinine. -Lasix daily as per above -Losartan 50 mg, spironolactone 12.5 daily.  -Start carvedilol 6.25 daily   Elevated Liver Enzymes Hyperbilirubinemia Abdominal Distension Patient's abdominal distention and firmness have improved.  Liver enzymes trending downward.  Suspect acute elevation secondary to his hepatic congestion from acute heart failure.  Ultrasound with findings suggestive of chronic liver disease.  Plan to discuss his risk with alcohol use and causing progression of this as well as  his heart failure.  Naltrexone is an option however cost may be prohibitive. -Continue to encourage alcohol cessation   Type II diabetes mellitus A1c is 6.7, this is a new diagnosis for this patient.  Glucose levels have been appropriate, will hold off on starting SSI.   Tobacco Use Disorder Patient with extensive cigarette smoking history.  Denies needing nicotine patch.  Discussed with patient the importance of smoking cessation.  He notes in the past he quit with Chantix but had difficulty affording it.  Prior to discharge we will discuss with him further, can consider Wellbutrin. -Continue smoking cessation discussions.   Diet: Heart Healthy IVF: None,None VTE: Enoxaparin Code: Full PT/OT recs: None, none.  Dispo: Anticipated discharge to Home in 3 to 4 days pending further work-up of his new diagnosis of heart failure.  Thalia Bloodgood DO Internal Medicine  Resident PGY-1 Pager [phone] Please contact the on call pager after 5 pm and on weekends at [phone].

## 2020-09-23 NOTE — Progress Notes (Addendum)
Progress Note  Patient Name: Juan Kent Date of Encounter: 09/23/2020  Primary Cardiologist:  Parke Poisson, MD       Subjective   No recurrent chest pain. Diuresed well. Could look at [facility] tomorrow or definitely by Friday  Inpatient Medications    Scheduled Meds:  aspirin  81 mg Oral Daily   atorvastatin  40 mg Oral Daily   enoxaparin (LOVENOX) injection  40 mg Subcutaneous Q24H   furosemide  40 mg Intravenous BID   losartan  50 mg Oral Daily   potassium chloride  40 mEq Oral BID   sodium chloride flush  3 mL Intravenous Q12H   spironolactone  12.5 mg Oral Daily   Continuous Infusions:  sodium chloride     PRN Meds: sodium chloride, acetaminophen, ondansetron (ZOFRAN) IV, polyethylene glycol, sodium chloride flush   Vital Signs    Vitals:   09/23/20 0355 09/23/20 0450 09/23/20 0737 09/23/20 0800  BP: (!) 171/118   (!) 165/110  Pulse:   89   Resp: 18   20  Temp: 98.4 F (36.9 C)  97.6 F (36.4 C)   TempSrc: Oral  Oral   SpO2: 97%   97%  Weight:  76.8 kg    Height:        Intake/Output Summary (Last 24 hours) at 09/23/2020 0940 Last data filed at 09/23/2020 0700 Gross per 24 hour  Intake 1610 ml  Output 7900 ml  Net -6290 ml   Filed Weights   09/22/20 0500 09/23/20 0032 09/23/20 0450  Weight: 80.7 kg 76.8 kg 76.8 kg    Physical Exam   General: Well developed, well nourished, NAD Neck: Negative for carotid bruits. No JVD Lungs:Clear to ausculation bilaterally. No wheezes, rales, or rhonchi. Breathing is unlabored. Cardiovascular: RRR with S1 S2. No murmurs Abdomen: Soft, non-tender, non-distended. No obvious abdominal masses. Extremities: No edema. Neuro: Alert and oriented. No focal deficits. No facial asymmetry. MAE spontaneously. Psych: Responds to questions appropriately with normal affect.    Labs    Chemistry Recent Labs  Lab 09/21/20 1041 09/22/20 0309 09/23/20 0245  NA 140 140 141  K 3.4* 3.1* 3.4*  CL 105 105 102  CO2 25 26  29   GLUCOSE 109* 150* 119*  BUN 19 18 21   CREATININE 1.09 1.07 1.26*  CALCIUM 9.1 8.9 9.4  PROT 6.4* 6.0* 5.7*  ALBUMIN 3.3* 3.1* 3.0*  AST 78* 67* 43*  ALT 115* 106* 77*  ALKPHOS 112 112 97  BILITOT 1.6* 1.7* 1.2  GFRNONAA >60 >60 >60  ANIONGAP 10 9 10      Hematology Recent Labs  Lab 09/21/20 1041  WBC 10.7*  RBC 5.41  HGB 15.6  HCT 50.4  MCV 93.2  MCH 28.8  MCHC 31.0  RDW 15.0  PLT 348    Cardiac EnzymesNo results for input(s): TROPONINI in the last 168 hours. No results for input(s): TROPIPOC in the last 168 hours.   BNP Recent Labs  Lab 09/21/20 1041  BNP 1,739.7*     DDimer No results for input(s): DDIMER in the last 168 hours.   Radiology    DG Chest 2 View  Result Date: 09/21/2020 CLINICAL DATA:  Shortness of breath for 2 weeks. EXAM: CHEST - 2 VIEW COMPARISON:  None. FINDINGS: Cardiomegaly and mild pulmonary vascular congestion noted. Focal opacity overlying the mid LEFT lung may represent atelectasis or airspace disease. A trace LEFT pleural effusion is noted. No pneumothorax or acute bony abnormality identified. IMPRESSION: 1.  Focal opacity overlying the mid LEFT lung - question atelectasis versus airspace disease/pneumonia. Radiographic follow-up to resolution is recommended. 2. Trace LEFT pleural effusion. 3. Cardiomegaly and mild pulmonary vascular congestion. Electronically Signed   By: Harmon Pier M.D.   On: 09/21/2020 11:17   ECHOCARDIOGRAM COMPLETE  Result Date: 09/22/2020    ECHOCARDIOGRAM REPORT   Patient Name:   Juan Kent Date of Exam: 09/22/2020 Medical Rec #:  [id number]  Height:       66.0 in Accession #:    [id number] Weight:       178.0 lb Date of Birth:  04-28-1955  BSA:          1.903 m Patient Age:    65 years   BP:           145/114 mmHg Patient Gender: M          HR:           90 bpm. Exam Location:  Inpatient Procedure: 2D Echo, 3D Echo, Cardiac Doppler, Color Doppler and Intracardiac            Opacification Agent STAT ECHO Indications:     I50.40* Unspecified combined systolic (congestive) and diastolic                 (congestive) heart failure  History:        Patient has no prior history of Echocardiogram examinations.                 Risk Factors:Hypertension.  Sonographer:    Sheralyn Boatman RDCS Referring Phys: [id number] JOSHUA ZAVITZ IMPRESSIONS  1. Left ventricular ejection fraction, by estimation, is 20 to 25%. The left ventricle has severely decreased function. The left ventricle demonstrates global hypokinesis. The left ventricular internal cavity size was mildly dilated. There is mild left ventricular hypertrophy. Left ventricular diastolic parameters are consistent with Grade III diastolic dysfunction (restrictive). Elevated left atrial pressure.  2. Right ventricular systolic function is mildly reduced. The right ventricular size is normal. There is moderately elevated pulmonary artery systolic pressure.  3. Left atrial size was moderately dilated.  4. Right atrial size was moderately dilated.  5. The mitral valve is normal in structure. Mild to moderate mitral valve regurgitation. No evidence of mitral stenosis.  6. Tricuspid valve regurgitation is moderate.  7. The aortic valve is tricuspid. Aortic valve regurgitation is trivial. No aortic stenosis is present.  8. The inferior vena cava is dilated in size with <50% respiratory variability, suggesting right atrial pressure of 15 mmHg. FINDINGS  Left Ventricle: Left ventricular ejection fraction, by estimation, is 20 to 25%. The left ventricle has severely decreased function. The left ventricle demonstrates global hypokinesis. Definity contrast agent was given IV to delineate the left ventricular endocardial borders. The left ventricular internal cavity size was mildly dilated. There is mild left ventricular hypertrophy. Left ventricular diastolic parameters are consistent with Grade III diastolic dysfunction (restrictive). Elevated left atrial pressure. Right Ventricle: The right ventricular  size is normal. Right ventricular systolic function is mildly reduced. There is moderately elevated pulmonary artery systolic pressure. The tricuspid regurgitant velocity is 2.84 m/s, and with an assumed right atrial pressure of 15 mmHg, the estimated right ventricular systolic pressure is 47.3 mmHg. Left Atrium: Left atrial size was moderately dilated. Right Atrium: Right atrial size was moderately dilated. Pericardium: There is no evidence of pericardial effusion. Mitral Valve: The mitral valve is normal in structure. Mild mitral annular calcification. Mild to moderate mitral valve regurgitation. No  evidence of mitral valve stenosis. Tricuspid Valve: The tricuspid valve is normal in structure. Tricuspid valve regurgitation is moderate . No evidence of tricuspid stenosis. Aortic Valve: The aortic valve is tricuspid. Aortic valve regurgitation is trivial. No aortic stenosis is present. Pulmonic Valve: The pulmonic valve was normal in structure. Pulmonic valve regurgitation is not visualized. No evidence of pulmonic stenosis. Aorta: The aortic root is normal in size and structure. Venous: The inferior vena cava is dilated in size with less than 50% respiratory variability, suggesting right atrial pressure of 15 mmHg. IAS/Shunts: No atrial level shunt detected by color flow Doppler.  LEFT VENTRICLE PLAX 2D LVIDd:         5.50 cm      Diastology LVIDs:         5.00 cm      LV e' medial:    4.16 cm/s LV PW:         1.30 cm      LV E/e' medial:  21.2 LV IVS:        1.20 cm      LV e' lateral:   6.02 cm/s LVOT diam:     1.80 cm      LV E/e' lateral: 14.7 LV SV:         24 LV SV Index:   13 LVOT Area:     2.54 cm  LV Volumes (MOD) LV vol d, MOD A2C: 159.0 ml LV vol d, MOD A4C: 106.0 ml LV vol s, MOD A2C: 131.0 ml LV vol s, MOD A4C: 98.5 ml LV SV MOD A2C:     28.0 ml LV SV MOD A4C:     106.0 ml LV SV MOD BP:      14.4 ml RIGHT VENTRICLE            IVC RV S prime:     6.75 cm/s  IVC diam: 2.40 cm TAPSE (M-mode): 0.8 cm LEFT  ATRIUM             Index       RIGHT ATRIUM           Index LA diam:        4.60 cm 2.42 cm/m  RA Area:     22.90 cm LA Vol (A2C):   48.3 ml 25.38 ml/m RA Volume:   74.80 ml  39.30 ml/m LA Vol (A4C):   50.0 ml 26.27 ml/m LA Biplane Vol: 51.6 ml 27.11 ml/m  AORTIC VALVE LVOT Vmax:   76.30 cm/s LVOT Vmean:  43.700 cm/s LVOT VTI:    0.094 m  AORTA Ao Root diam: 3.60 cm Ao Asc diam:  3.70 cm MITRAL VALVE               TRICUSPID VALVE MV Area (PHT): 4.66 cm    TR Peak grad:   32.3 mmHg MV Decel Time: 163 msec    TR Vmax:        284.00 cm/s MV E velocity: 88.38 cm/s                            SHUNTS                            Systemic VTI:  0.09 m  Systemic Diam: 1.80 cm Olga MillersBrian Crenshaw MD Electronically signed by Olga MillersBrian Crenshaw MD Signature Date/Time: 09/22/2020/12:06:18 PM    Final    US Abdomen Limited RUQ (LIVER/GB)  Result Date: 09/21/2020 CLINICAL DATA:  Elevated LFT EXAM: ULTRASOUND ABDOMEN LIMITED RIGHT UPPER QUADRANT COMPARISON:  None. FINDINGS: Gallbladder: No shadowing stones. Slight increased wall thickness at 4.3 mm. Negative sonographic Murphy. Common bile duct: Diameter: 3.6 mm Liver: Nodular hepatic contour consistent with cirrhosis. No focal hepatic abnormality. Portal vein is patent on color Doppler imaging with normal direction of blood flow towards the liver. Other: Right pleural effusion IMPRESSION: 1. Sonographic findings consistent with hepatic cirrhosis. 2. Slightly thickened gallbladder wall, nonspecific. No shadowing stones 3. Right pleural effusion Electronically Signed   By: Jasmine PangKim  Fujinaga M.D.   On: 09/21/2020 18:49    Telemetry    09/23/20 NSR 70's  - Personally Reviewed  ECG    No new tracing as of 09/23/20 - Personally Reviewed  Cardiac Studies   Echo 09/22/20    1. Left ventricular ejection fraction, by estimation, is 20 to 25%. The  left ventricle has severely decreased function. The left ventricle  demonstrates global hypokinesis. The left  ventricular internal cavity size  was mildly dilated. There is mild left  ventricular hypertrophy. Left ventricular diastolic parameters are  consistent with Grade III diastolic dysfunction (restrictive). Elevated  left atrial pressure.   2. Right ventricular systolic function is mildly reduced. The right  ventricular size is normal. There is moderately elevated pulmonary artery  systolic pressure.   3. Left atrial size was moderately dilated.   4. Right atrial size was moderately dilated.   5. The mitral valve is normal in structure. Mild to moderate mitral valve  regurgitation. No evidence of mitral stenosis.   6. Tricuspid valve regurgitation is moderate.   7. The aortic valve is tricuspid. Aortic valve regurgitation is trivial.  No aortic stenosis is present.   8. The inferior vena cava is dilated in size with <50% respiratory  variability, suggesting right atrial pressure of 15 mmHg.  Patient Profile     65 y.o. male with a hx of untreated HTN, tobacco use, HLD, GERD presented with acute SOB and weakness who is being seen 09/22/2020 for the evaluation of CHF and cardiomyopathy at the request of Dr. Oswaldo DoneVincent.   Assessment & Plan    1. Acute on chronic systolic and diastolic CHF/RHF: -Echocardiogram with LVEF at 20-25% with G3DD, mildly reduced RV, mod elevated PASP, mild to mod MR, mod TR. Plan to continue with diuresis until close to euvolemic then [facility]  -Creatinine up to 1.26 today from 1.07 -Weight, 169lb with an admission weight at 180lb  -I&O, net neg 8.1L -HsT, 78>>>67>>flat trend not consistent with ACS -Continue ASA, statin, spiro, losartan  -Add BB once more euvolemic  -Continue IV Lasix 40mg  BID>>would like to attempt cath tomorrow or Friday   2. New onset DM2: -Hb A1c, 6.7 this admission  -SSI for glucose control while inpatient -Consider SGLT2i   3. Tobacco Use: -Cessation strongly encouraged   4. Chronic liver  disease with ETOH disorder: -AST/ALT elevated on admission at 78/115>>improved to 43/77  5. HLD: -LDL, 134 -Continue statin therapy  -Watch LFTs   Signed, Georgie ChardJill McDaniel NP-C [facility] Pager: [phone] 09/23/2020, 9:40 AM     For questions or updates, please contact   Please consult [email or web address] for contact info under Cardiology/STEMI.   Patient seen and examined with Georgie ChardJill McDaniel NP-C.  Agree as above, with the following exceptions and changes as noted below. Improving with adequate diuresis. Gen: NAD, CV: RRR, no murmurs, Lungs: clear, Abd: soft, Extrem: Warm, well perfused, 1+ edema, Neuro/Psych: alert and oriented x 3, normal mood and affect. All available labs, radiology testing, previous records reviewed. Cr with mild trend upward likely 2/2 to diuresis and initiation of HF therapy. Would see Cr trend tomorrow and based on that consider cath tomorrow or Friday, but likely Friday. Agree with meds as above and per internal medicine progress note from today.   Parke Poisson, MD 09/23/20 2:32 PM

## 2020-09-24 DIAGNOSIS — Z006 Encounter for examination for normal comparison and control in clinical research program: Secondary | ICD-10-CM

## 2020-09-24 LAB — BASIC METABOLIC PANEL
Anion gap: 10 (ref 5–15)
BUN: 21 mg/dL (ref 8–23)
CO2: 25 mmol/L (ref 22–32)
Calcium: 9.8 mg/dL (ref 8.9–10.3)
Chloride: 103 mmol/L (ref 98–111)
Creatinine, Ser: 1.23 mg/dL (ref 0.61–1.24)
GFR, Estimated: >60 mL/min (ref >60)
Glucose, Bld: 117 mg/dL — ABNORMAL HIGH (ref 70–99)
Potassium: 4.2 mmol/L (ref 3.5–5.1)
Sodium: 138 mmol/L (ref 135–145)

## 2020-09-24 LAB — CBC WITH DIFFERENTIAL/PLATELET
Abs Immature Granulocytes: 0.04 10*3/uL (ref 0.00–0.07)
Basophils Absolute: 0.1 10*3/uL (ref 0.0–0.1)
Basophils Relative: 1 %
Eosinophils Absolute: 0.3 10*3/uL (ref 0.0–0.5)
Eosinophils Relative: 3 %
HCT: 47.3 % (ref 39.0–52.0)
Hemoglobin: 14.8 g/dL (ref 13.0–17.0)
Immature Granulocytes: 1 %
Lymphocytes Relative: 24 %
Lymphs Abs: 2.2 10*3/uL (ref 0.7–4.0)
MCH: 28.6 pg (ref 26.0–34.0)
MCHC: 31.3 g/dL (ref 30.0–36.0)
MCV: 91.5 fL (ref 80.0–100.0)
Monocytes Absolute: 0.9 10*3/uL (ref 0.1–1.0)
Monocytes Relative: 10 %
Neutro Abs: 5.4 10*3/uL (ref 1.7–7.7)
Neutrophils Relative %: 61 %
Platelets: 342 10*3/uL (ref 150–400)
RBC: 5.17 MIL/uL (ref 4.22–5.81)
RDW: 15.1 % (ref 11.5–15.5)
WBC: 8.9 10*3/uL (ref 4.0–10.5)
nRBC: 0 % (ref 0.0–0.2)

## 2020-09-24 LAB — MAGNESIUM: Magnesium: 2.2 mg/dL (ref 1.7–2.4)

## 2020-09-24 MED ORDER — SODIUM CHLORIDE 0.9 % IV SOLN: 250.0000 mL | INTRAVENOUS | Status: DC | PRN

## 2020-09-24 MED ORDER — ASPIRIN 81 MG PO CHEW
81.0000 mg | CHEWABLE_TABLET | ORAL | Status: AC
  Administered 2020-09-25: 81 mg via ORAL
  Filled 2020-09-24: qty 1

## 2020-09-24 MED ORDER — SODIUM CHLORIDE 0.9 % IV SOLN: INTRAVENOUS | Status: DC

## 2020-09-24 MED ORDER — ASPIRIN 81 MG PO CHEW
81.0000 mg | CHEWABLE_TABLET | Freq: Every day | ORAL | Status: DC
  Administered 2020-09-26 – 2020-09-28 (×3): 81 mg via ORAL
  Filled 2020-09-24 (×3): qty 1

## 2020-09-24 MED ORDER — SODIUM CHLORIDE 0.9% FLUSH: 3.0000 mL | Freq: Two times a day (BID) | INTRAVENOUS | Status: DC

## 2020-09-24 MED ORDER — SODIUM CHLORIDE 0.9% FLUSH: 3.0000 mL | INTRAVENOUS | Status: DC | PRN

## 2020-09-24 NOTE — Research (Signed)
IDENTIFY Informed Consent                  Subject Name: Juan Kent    Subject met inclusion and exclusion criteria.  The informed consent form, study requirements and expectations were reviewed with the subject and questions and concerns were addressed prior to the signing of the consent form.  The subject verbalized understanding of the trial requirements.  The subject agreed to participate in the IDENTIFY trial and signed the informed consent at 15:47PM on 09/24/20.  The informed consent was obtained prior to performance of any protocol-specific procedures for the subject.  A copy of the signed informed consent was given to the subject and a copy was placed in the subject's medical record.   Meade Maw , Naval architect

## 2020-09-24 NOTE — H&P (View-Only) (Signed)
Progress Note  Patient Name: Juan Kent Date of Encounter: 09/24/2020  Primary Cardiologist:  Parke Poisson, MD       Subjective   No recurrent chest pain. Diuresing well. Discussed [facility] tomorrow.   Inpatient Medications    Scheduled Meds:  aspirin  81 mg Oral Daily   atorvastatin  40 mg Oral Daily   carvedilol  6.25 mg Oral BID WC   enoxaparin (LOVENOX) injection  40 mg Subcutaneous Q24H   furosemide  40 mg Intravenous BID   losartan  50 mg Oral Daily   potassium chloride  40 mEq Oral BID   sodium chloride flush  3 mL Intravenous Q12H   spironolactone  12.5 mg Oral Daily   Continuous Infusions:  sodium chloride     PRN Meds: sodium chloride, acetaminophen, cyclobenzaprine, ondansetron (ZOFRAN) IV, polyethylene glycol, sodium chloride flush   Vital Signs    Vitals:   09/24/20 0000 09/24/20 0022 09/24/20 0300 09/24/20 0735  BP:  (!) 152/98 (!) 126/97 (!) 137/110  Pulse:    84  Resp: (!) 22 11 19 20   Temp:  (!) 97.4 F (36.3 C) 97.8 F (36.6 C) (!) 97.5 F (36.4 C)  TempSrc:  Oral Oral Oral  SpO2:  97% 98% 96%  Weight:  76.3 kg    Height:        Intake/Output Summary (Last 24 hours) at 09/24/2020 0935 Last data filed at 09/24/2020 0847 Gross per 24 hour  Intake 1320 ml  Output 4650 ml  Net -3330 ml   Filed Weights   09/23/20 0032 09/23/20 0450 09/24/20 0022  Weight: 76.8 kg 76.8 kg 76.3 kg    Physical Exam   GEN: No acute distress.   Neck: at 0 deg, JVD to upper 1/3 of neck. At 45 deg, JVD to lower 1/3 of neck.  Cardiac: RRR, no murmurs, rubs, or gallops.  Respiratory: Clear to auscultation bilaterally. GI: Soft, nontender, non-distended  MS: No edema; No deformity. Neuro:  Nonfocal  Psych: Normal affect    Labs    Chemistry Recent Labs  Lab 09/21/20 1041 09/22/20 0309 09/23/20 0245 09/24/20 0256  NA 140 140 141 138  K 3.4* 3.1* 3.4* 4.2  CL 105 105 102 103  CO2 25 26 29 25   GLUCOSE 109* 150* 119* 117*  BUN 19 18 21 21    CREATININE 1.09 1.07 1.26* 1.23  CALCIUM 9.1 8.9 9.4 9.8  PROT 6.4* 6.0* 5.7*  --   ALBUMIN 3.3* 3.1* 3.0*  --   AST 78* 67* 43*  --   ALT 115* 106* 77*  --   ALKPHOS 112 112 97  --   BILITOT 1.6* 1.7* 1.2  --   GFRNONAA >60 >60 >60 >60  ANIONGAP 10 9 10 10      Hematology Recent Labs  Lab 09/21/20 1041  WBC 10.7*  RBC 5.41  HGB 15.6  HCT 50.4  MCV 93.2  MCH 28.8  MCHC 31.0  RDW 15.0  PLT 348    Cardiac EnzymesNo results for input(s): TROPONINI in the last 168 hours. No results for input(s): TROPIPOC in the last 168 hours.   BNP Recent Labs  Lab 09/21/20 1041  BNP 1,739.7*     DDimer No results for input(s): DDIMER in the last 168 hours.   Radiology    No results found.  Telemetry    NSR - Personally Reviewed  ECG    No new - Personally Reviewed  Cardiac Studies  Echo 09/22/20    1. Left ventricular ejection fraction, by estimation, is 20 to 25%. The  left ventricle has severely decreased function. The left ventricle  demonstrates global hypokinesis. The left ventricular internal cavity size  was mildly dilated. There is mild left  ventricular hypertrophy. Left ventricular diastolic parameters are  indeterminate. Elevated left atrial pressure.   2. Right ventricular systolic function is mildly reduced. The right  ventricular size is normal. There is moderately elevated pulmonary artery  systolic pressure.   3. Left atrial size was moderately dilated.   4. Right atrial size was moderately dilated.   5. The mitral valve is normal in structure. Mild to moderate mitral valve  regurgitation. No evidence of mitral stenosis.   6. Tricuspid valve regurgitation is moderate.   7. The aortic valve is tricuspid. Aortic valve regurgitation is trivial.  No aortic stenosis is present.   8. The inferior vena cava is dilated in size with <50% respiratory  variability, suggesting right atrial pressure of 15 mmHg.  Patient Profile     65 y.o. male with a hx of  untreated HTN, tobacco use, HLD, GERD presented with acute SOB and weakness who is being seen 09/22/2020 for the evaluation of CHF and cardiomyopathy at the request of Dr. Oswaldo Done.   Assessment & Plan    1. 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 Will reassess after RHC. Heart Failure Therapy  ACE-I/ARB/ARNI: losartan 50 mg daily (Entresto if affordable) BB: carvedilol 6.25 mg BID MRA: spironolactone 12.5 mg daily SGLT2I: would be optimal candidate for [place] or Jardiance, recommend pursuing patient assistance. HbA1c 6.7 %  - plan for [facility] tomorrow, diuresing aggressively and feeling better, sats normal at rest and LE edema resolved. Abd soft. JVD improving. Strongly suspect CAD given risk factors heavy smoking, HTN, HLD, DM2. Consent below.  2. New onset DM2: -Hb A1c, 6.7 this admission  - Recommend SGLT2i   3. Tobacco Use: -Cessation strongly encouraged, he has done well since admission and is motivated.  4. Chronic liver disease with ETOH disorder: -AST/ALT elevated on admission at 78/115>>improved to 43/77  5. HLD: -LDL, 134 -Continue statin therapy for now - he has hx of statin intolerance. If CAD will be good candidate for PCSK9i.   INFORMED CONSENT: I have reviewed the risks, indications, and alternatives to cardiac catheterization, possible angioplasty, and stenting with the patient. Risks include but are not limited to bleeding, infection, vascular injury, stroke, myocardial infarction, arrhythmia, kidney injury, radiation-related injury in the case of prolonged fluoroscopy use, emergency cardiac surgery, and death. The patient understands the risks of serious complication is 1-2 in 1000 with diagnostic cardiac cath and 1-2% or less with angioplasty/stenting.  INFORMED CONSENT: I have reviewed the risks,  indications, and alternatives to right heart catheterization with the patient. Risks include but are not limited to bleeding, infection, vascular injury, stroke, myocardial infarction, arrhythmia, kidney injury, radiation-related injury in the case of prolonged fluoroscopy use, emergency cardiac surgery, and death. The patient understands the risks of serious complication is 1-2 in 1000 with diagnostic cardiac cath, and is willing to proceed.  Parke Poisson, MD 09/24/20 9:35 AM

## 2020-09-24 NOTE — Progress Notes (Signed)
Heart Failure Stewardship Pharmacist Progress Note   PCP: Pcp, No PCP-Cardiologist: Parke Poisson, MD    HPI:  65 yo M w/ PMH of untreated HTN, tobacco use, HLD, and GERD who presented 6/20 with acute SOB and weakness x2-3 weeks. Wakes up in middle of night with dyspnea and palpitations, noted significant LEE. Hypertensive urgency upon arrival BP 220/145, resolved with IV hydralazine. ECHO revealed EF 20-25%. Pending [facility] tomorrow.   Current HF Medications: - Carvedilol 6.25mg  BID - Furosemide 40mg  IV BID - Losartan 50mg  daily - Spironolactone 12.5mg  daily - Potassium BID  Prior to admission HF Medications: - None  Pertinent Lab Values: Serum creatinine 1.23, BUN 21, Potassium 4.2, Sodium 138, BNP 1739, Magnesium 2.2 A1c 6.7  Vital Signs: Weight: 168 lbs (admission weight: 180 lbs) Blood pressure: 130-140/90s Heart rate: 70s   Medication Assistance / Insurance Benefits Check: Does the patient have prescription insurance?  No  Does the patient qualify for medication assistance through manufacturers or grants?   Yes Eligible grants and/or patient assistance programs: Pending Medication assistance applications in progress: N/A  Medication assistance applications approved: N/A Approved medication assistance renewals will be completed by: Pending  Outpatient Pharmacy:  Prior to admission outpatient pharmacy: [facility] Is the patient willing to use [facility] TOC pharmacy at discharge? Yes Is the patient willing to transition their outpatient pharmacy to utilize a [facility] outpatient pharmacy?   Pending    Assessment: 1. Acute on chronic systolic CHF (EF [facility]), likely due to NICM (uncontrolled HTN). NYHA class III symptoms. - Continue furosemide 40 mg IV BID - Continue carvedilol 6.25 mg BID - Continue losartan 25 mg daily - Continue spironolactone 12.5 mg daily - Consider starting Farxiga 10 mg daily prior to discharge  Plan: 1) Medication changes recommended  at this time: - Continue current regimen - cath tomorrow  2) Patient assistance: - If starting Entresto or SGLT2, can obtain patient assistance  3)  Education  - To be completed prior to discharge - HF [facility] appointment 6/29  [facility], PharmD, BCPS Heart Failure Stewardship Pharmacist Phone [phone]  Please check [email or web address] for unit-specific pharmacist phone numbers

## 2020-09-24 NOTE — Progress Notes (Addendum)
Heart Failure Navigation Team Progress Note  PCP: Pcp, No Primary Cardiologist: N/A Admitted from: home  Past Medical History:  Diagnosis Date   Hypertension     Social History   Socioeconomic History   Marital status: Widowed    Spouse name: Not on file   Number of children: Not on file   Years of education: Not on file   Highest education level: Not on file  Occupational History   Not on file  Tobacco Use   Smoking status: Every Day    Pack years: 0.00    Types: Cigarettes   Smokeless tobacco: Never  Substance and Sexual Activity   Alcohol use: Yes   Drug use: Never   Sexual activity: Not on file  Other Topics Concern   Not on file  Social History Narrative   Not on file   Social Determinants of Health   Financial Resource Strain: High Risk   Difficulty of Paying Living Expenses: Hard  Food Insecurity: Food Insecurity Present   Worried About Running Out of Food in the Last Year: Sometimes true   Ran Out of Food in the Last Year: Sometimes true  Transportation Needs: No Transportation Needs   Lack of Transportation (Medical): No   Lack of Transportation (Non-Medical): No  Physical Activity: Not on file  Stress: Not on file  Social Connections: Not on file     Heart & Vascular Transition of Care Clinic follow-up: Scheduled for 09/30/20 at 9am.  Confirmed patient has transportation.  Immediate social needs: No health insurance, medication assistance, Food Stamp and Disability application (although soon patient will be 66) needed.  CSW provided patient with a [employer] application to bring with him to the [facility] clinic appointment. CSW reached out to [employer] to screen patient for Medicaid. Patient is 40 and will soon qualify for Medicare.  Caresse Sedivy, MSW, LCSWA [phone] Heart Failure Social Worker

## 2020-09-24 NOTE — Progress Notes (Addendum)
   Progress Note  Patient Name: Juan Kent Date of Encounter: 09/24/2020  Primary Cardiologist:  Minnette Merida A Sudiksha Victor, MD       Subjective   No recurrent chest pain. Diuresing well. Discussed R/LHC tomorrow.   Inpatient Medications    Scheduled Meds:  aspirin  81 mg Oral Daily   atorvastatin  40 mg Oral Daily   carvedilol  6.25 mg Oral BID WC   enoxaparin (LOVENOX) injection  40 mg Subcutaneous Q24H   furosemide  40 mg Intravenous BID   losartan  50 mg Oral Daily   potassium chloride  40 mEq Oral BID   sodium chloride flush  3 mL Intravenous Q12H   spironolactone  12.5 mg Oral Daily   Continuous Infusions:  sodium chloride     PRN Meds: sodium chloride, acetaminophen, cyclobenzaprine, ondansetron (ZOFRAN) IV, polyethylene glycol, sodium chloride flush   Vital Signs    Vitals:   09/24/20 0000 09/24/20 0022 09/24/20 0300 09/24/20 0735  BP:  (!) 152/98 (!) 126/97 (!) 137/110  Pulse:    84  Resp: (!) 22 11 19 20  Temp:  (!) 97.4 F (36.3 C) 97.8 F (36.6 C) (!) 97.5 F (36.4 C)  TempSrc:  Oral Oral Oral  SpO2:  97% 98% 96%  Weight:  76.3 kg    Height:        Intake/Output Summary (Last 24 hours) at 09/24/2020 0935 Last data filed at 09/24/2020 0847 Gross per 24 hour  Intake 1320 ml  Output 4650 ml  Net -3330 ml   Filed Weights   09/23/20 0032 09/23/20 0450 09/24/20 0022  Weight: 76.8 kg 76.8 kg 76.3 kg    Physical Exam   GEN: No acute distress.   Neck: at 0 deg, JVD to upper 1/3 of neck. At 45 deg, JVD to lower 1/3 of neck.  Cardiac: RRR, no murmurs, rubs, or gallops.  Respiratory: Clear to auscultation bilaterally. GI: Soft, nontender, non-distended  MS: No edema; No deformity. Neuro:  Nonfocal  Psych: Normal affect    Labs    Chemistry Recent Labs  Lab 09/21/20 1041 09/22/20 0309 09/23/20 0245 09/24/20 0256  NA 140 140 141 138  K 3.4* 3.1* 3.4* 4.2  CL 105 105 102 103  CO2 25 26 29 25  GLUCOSE 109* 150* 119* 117*  BUN 19 18 21 21   CREATININE 1.09 1.07 1.26* 1.23  CALCIUM 9.1 8.9 9.4 9.8  PROT 6.4* 6.0* 5.7*  --   ALBUMIN 3.3* 3.1* 3.0*  --   AST 78* 67* 43*  --   ALT 115* 106* 77*  --   ALKPHOS 112 112 97  --   BILITOT 1.6* 1.7* 1.2  --   GFRNONAA >60 >60 >60 >60  ANIONGAP 10 9 10 10     Hematology Recent Labs  Lab 09/21/20 1041  WBC 10.7*  RBC 5.41  HGB 15.6  HCT 50.4  MCV 93.2  MCH 28.8  MCHC 31.0  RDW 15.0  PLT 348    Cardiac EnzymesNo results for input(s): TROPONINI in the last 168 hours. No results for input(s): TROPIPOC in the last 168 hours.   BNP Recent Labs  Lab 09/21/20 1041  BNP 1,739.7*     DDimer No results for input(s): DDIMER in the last 168 hours.   Radiology    No results found.  Telemetry    NSR - Personally Reviewed  ECG    No new - Personally Reviewed  Cardiac Studies     Echo 09/22/20    1. Left ventricular ejection fraction, by estimation, is 20 to 25%. The  left ventricle has severely decreased function. The left ventricle  demonstrates global hypokinesis. The left ventricular internal cavity size  was mildly dilated. There is mild left  ventricular hypertrophy. Left ventricular diastolic parameters are  indeterminate. Elevated left atrial pressure.   2. Right ventricular systolic function is mildly reduced. The right  ventricular size is normal. There is moderately elevated pulmonary artery  systolic pressure.   3. Left atrial size was moderately dilated.   4. Right atrial size was moderately dilated.   5. The mitral valve is normal in structure. Mild to moderate mitral valve  regurgitation. No evidence of mitral stenosis.   6. Tricuspid valve regurgitation is moderate.   7. The aortic valve is tricuspid. Aortic valve regurgitation is trivial.  No aortic stenosis is present.   8. The inferior vena cava is dilated in size with <50% respiratory  variability, suggesting right atrial pressure of 15 mmHg.  Patient Profile     65 y.o. male with a hx of  untreated HTN, tobacco use, HLD, GERD presented with acute SOB and weakness who is being seen 09/22/2020 for the evaluation of CHF and cardiomyopathy at the request of Dr. Oswaldo Done.   Assessment & Plan    1. 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 Will reassess after RHC. Heart Failure Therapy  ACE-I/ARB/ARNI: losartan 50 mg daily (Entresto if affordable) BB: carvedilol 6.25 mg BID MRA: spironolactone 12.5 mg daily SGLT2I: would be optimal candidate for [place] or Jardiance, recommend pursuing patient assistance. HbA1c 6.7 %  - plan for [facility] tomorrow, diuresing aggressively and feeling better, sats normal at rest and LE edema resolved. Abd soft. JVD improving. Strongly suspect CAD given risk factors heavy smoking, HTN, HLD, DM2. Consent below.  2. New onset DM2: -Hb A1c, 6.7 this admission  - Recommend SGLT2i   3. Tobacco Use: -Cessation strongly encouraged, he has done well since admission and is motivated.  4. Chronic liver disease with ETOH disorder: -AST/ALT elevated on admission at 78/115>>improved to 43/77  5. HLD: -LDL, 134 -Continue statin therapy for now - he has hx of statin intolerance. If CAD will be good candidate for PCSK9i.   INFORMED CONSENT: I have reviewed the risks, indications, and alternatives to cardiac catheterization, possible angioplasty, and stenting with the patient. Risks include but are not limited to bleeding, infection, vascular injury, stroke, myocardial infarction, arrhythmia, kidney injury, radiation-related injury in the case of prolonged fluoroscopy use, emergency cardiac surgery, and death. The patient understands the risks of serious complication is 1-2 in 1000 with diagnostic cardiac cath and 1-2% or less with angioplasty/stenting.  INFORMED CONSENT: I have reviewed the risks,  indications, and alternatives to right heart catheterization with the patient. Risks include but are not limited to bleeding, infection, vascular injury, stroke, myocardial infarction, arrhythmia, kidney injury, radiation-related injury in the case of prolonged fluoroscopy use, emergency cardiac surgery, and death. The patient understands the risks of serious complication is 1-2 in 1000 with diagnostic cardiac cath, and is willing to proceed.  Parke Poisson, MD 09/24/20 9:35 AM

## 2020-09-24 NOTE — Progress Notes (Addendum)
HD#3 Subjective:  Overnight Events: No acute events overnight  Examined and evaluated at bedside. Mentions having difficulty sleeping last night due to orthopnea, respiratory distress as well as leg cramps. He mentions that he sleeps upright on a chair at home and is not used to sleeping supine on a bed. Mentions noticing improvement in his lower extremity swelling.  Patient is interested in being seen in our clinic, we will arrange this at discharge.  Objective:  Vital signs in last 24 hours: Vitals:   09/24/20 0000 09/24/20 0022 09/24/20 0300 09/24/20 0735  BP:  (!) 152/98 (!) 126/97 (!) 137/110  Pulse:    84  Resp: (!) 22 11 19 20   Temp:  (!) 97.4 F (36.3 C) 97.8 F (36.6 C) (!) 97.5 F (36.4 C)  TempSrc:  Oral Oral Oral  SpO2:  97% 98% 96%  Weight:  76.3 kg    Height:       Supplemental O2: Nasal Cannula SpO2: 96 % O2 Flow Rate (L/min): (P) 2 L/min   Physical Exam:  Constitutional: well-appearing, resting in bed comfortably HENT: normocephalic atraumatic Eyes: conjunctiva non-erythematous Neck: supple Cardiovascular: regular rate and rhythm, no m/r/g.  JVD at the base of the neck.  Trace lower extremity edema Pulmonary/Chest: normal work of breathing on room air, crackles bilateral lung bases Abdominal: Nondistended, soft Neurological: alert & oriented x 3 Skin: warm and dry Psych: Normal mood and affect  Filed Weights   09/23/20 0032 09/23/20 0450 09/24/20 0022  Weight: 76.8 kg 76.8 kg 76.3 kg     Intake/Output Summary (Last 24 hours) at 09/24/2020 1013 Last data filed at 09/24/2020 0847 Gross per 24 hour  Intake 1320 ml  Output 4650 ml  Net -3330 ml   Net IO Since Admission: -11,490 mL [09/24/20 1013]  Pertinent Labs: CBC Latest Ref Rng & Units 09/21/2020  WBC 4.0 - 10.5 K/uL 10.7(H)  Hemoglobin 13.0 - 17.0 g/dL 09/23/2020  Hematocrit 75.6 - 52.0 % 50.4  Platelets 150 - 400 K/uL 348    CMP Latest Ref Rng & Units 09/24/2020 09/23/2020 09/22/2020   Glucose 70 - 99 mg/dL 09/24/2020) 295(J) 884(Z)  BUN 8 - 23 mg/dL 21 21 18   Creatinine 0.61 - 1.24 mg/dL 660(Y ) 3.01  Sodium 135 - 145 mmol/L 138 141 140  Potassium 3.5 - 5.1 mmol/L 4.2 3.4(L) 3.1(L)  Chloride 98 - 111 mmol/L 103 102 105  CO2 22 - 32 mmol/L 25 29 26   Calcium 8.9 - 10.3 mg/dL 9.8 9.4 8.9  Total Protein 6.5 - 8.1 g/dL - 5.7(L) 6.0(L)  Total Bilirubin 0.3 - 1.2 mg/dL - 1.2 6.01(U)  Alkaline Phos 38 - 126 U/L - 97 112  AST 15 - 41 U/L - 43(H) 67(H)  ALT 0 - 44 U/L - 77(H) 106(H)    Imaging: No results found.  Echocardiogram 09/22/20   1. Left ventricular ejection fraction, by estimation, is 20 to 25%. The  left ventricle has severely decreased function. The left ventricle  demonstrates global hypokinesis. The left ventricular internal cavity size  was mildly dilated. There is mild left  ventricular hypertrophy. Left ventricular diastolic parameters are  consistent with Grade III diastolic dysfunction (restrictive). Elevated  left atrial pressure.   2. Right ventricular systolic function is mildly reduced. The right  ventricular size is normal. There is moderately elevated pulmonary artery  systolic pressure.   3. Left atrial size was moderately dilated.   4. Right atrial size was moderately dilated.  5. The mitral valve is normal in structure. Mild to moderate mitral valve  regurgitation. No evidence of mitral stenosis.   6. Tricuspid valve regurgitation is moderate.   7. The aortic valve is tricuspid. Aortic valve regurgitation is trivial.  No aortic stenosis is present.   8. The inferior vena cava is dilated in size with <50% respiratory  variability, suggesting right atrial pressure of 15 mmHg.   Assessment/Plan:   Principal Problem:   Acute on chronic HFrEF (heart failure with reduced ejection fraction) (HCC) Active Problems:   Tobacco use disorder   Essential hypertension   Chronic liver disease   Alcohol use disorder   Patient Summary: Juan Kent is a 65 y.o. with a pertinent PMH of PMH of HTN, HLD, tobacco use disorder, who presented with dyspnea on exertion and anasarca and admitted for further evaluation of acute heart failure exacerbation  Acute on chronic HFrEF Exacerbation  Initially presented with anasarca and acute hypoxic respiratory failure.  Echocardiogram with pertinent findings of left ventricular ejection fraction of 20 to 25%.  Net weight loss of 5.3 kg, down 0.5 kg last 24 hours.  Patient continues to endorse orthopnea and muscle cramps but notes that his swelling has improved.  Does note that his urination has decreased in frequency over the past 24 hours.  Cardiology plan to take patient for left and right heart catheterization tomorrow morning.  We will make patient n.p.o. at midnight sips with meds.  Patient is without CAD, he will need further work-up for nonischemic cardiomyopathy.  We will continue his current medication regimen of Lasix, losartan, spironolactone and carvedilol.  Patient to work with medication assistance via heart failure team to assist patient in affording Entresto as well as Marcelline Deist. -Cardiology consult in place, appreciate their recommendations and assistance -Plan for RHC, LHC tomorrow.  N.p.o. at midnight.  DVT prophylaxis prior to procedure per cardiology -Lasix 40 mg twice daily -Losartan 50 mg, spironolactone 12.5 daily -Carvedilol 6.25 mg daily -Continue 81 mg aspirin and Lipitor 40 mg daily.   -Plan for Marcelline Deist as well as Entresto at discharge with medication assistance -Daily weight/I&O's -Daily BMP and mag levels, continue Klor-Con 40 mg daily -wean oxygen as tolerable -PT/OT consulted, no recommendations  Severe Asymptomatic Hypertension Improvement of blood pressure, last reading of 129/93.  We will continue with losartan, spironolactone, carvedilol. -Lasix daily as per above -Losartan 50 mg, spironolactone 12.5 daily, carvedilol 6.25 daily   Elevated Liver  Enzymes Hyperbilirubinemia Abdominal Distension Patient with significant provement on abdominal exam today, soft and nondistended. Ultrasound with findings suggestive of chronic liver disease.  We will continue to discuss alcohol cessation with the patient and possible medication options such as naltrexone. -Continue to encourage alcohol cessation   Type II diabetes mellitus A1c is 6.7, this is a new diagnosis for this patient.  Glucose levels have been appropriate, will hold off on starting SSI.  Will discuss prior to discharge.   Tobacco Use Disorder Will discuss medication options at discharge with patient.  Wellbutrin may be an option if Chantix as cost may be a barrier for this patient. -Continue smoking cessation discussions.   Diet: Heart Healthy, n.p.o. midnight sips with meds IVF: None,None VTE: Enoxaparin Code: Full PT/OT recs: None, none.  Dispo: Anticipated discharge to Home in 2 to 3 days pending left and right heart catheterization tomorrow by cardiology  Thalia Bloodgood DO Internal Medicine Resident PGY-1 Pager [phone] Please contact the on call pager after 5 pm and on weekends at  [phone].  

## 2020-09-25 ENCOUNTER — Encounter (HOSPITAL_COMMUNITY): Payer: Self-pay | Admitting: Interventional Cardiology

## 2020-09-25 ENCOUNTER — Encounter (HOSPITAL_COMMUNITY)
Admission: EM | Disposition: A | Discharge status: Home / Self Care | Payer: Self-pay | Source: Home / Self Care | Attending: Student in an Organized Health Care Education/Training Program

## 2020-09-25 DIAGNOSIS — I428 Other cardiomyopathies: Secondary | ICD-10-CM

## 2020-09-25 DIAGNOSIS — J810 Acute pulmonary edema: Secondary | ICD-10-CM

## 2020-09-25 DIAGNOSIS — E8770 Fluid overload, unspecified: Secondary | ICD-10-CM

## 2020-09-25 DIAGNOSIS — I2510 Atherosclerotic heart disease of native coronary artery without angina pectoris: Secondary | ICD-10-CM

## 2020-09-25 DIAGNOSIS — I5043 Acute on chronic combined systolic (congestive) and diastolic (congestive) heart failure: Secondary | ICD-10-CM

## 2020-09-25 DIAGNOSIS — I272 Pulmonary hypertension, unspecified: Secondary | ICD-10-CM

## 2020-09-25 HISTORY — PX: RIGHT/LEFT HEART CATH AND CORONARY ANGIOGRAPHY: CATH118266

## 2020-09-25 LAB — POCT I-STAT 7, (LYTES, BLD GAS, ICA,H+H)
Acid-Base Excess: 1 mmol/L (ref 0.0–2.0)
Bicarbonate: 23.5 mmol/L (ref 20.0–28.0)
Calcium, Ion: 1.26 mmol/L (ref 1.15–1.40)
HCT: 46 % (ref 39.0–52.0)
Hemoglobin: 15.6 g/dL (ref 13.0–17.0)
O2 Saturation: 98 %
Potassium: 4.5 mmol/L (ref 3.5–5.1)
Sodium: 139 mmol/L (ref 135–145)
TCO2: 24 mmol/L (ref 22–32)
pCO2 arterial: 31.6 mmHg — ABNORMAL LOW (ref 32.0–48.0)
pH, Arterial: 7.479 — ABNORMAL HIGH (ref 7.350–7.450)
pO2, Arterial: 104 mmHg (ref 83.0–108.0)

## 2020-09-25 LAB — POCT I-STAT EG7
Acid-Base Excess: 1 mmol/L (ref 0.0–2.0)
Acid-Base Excess: 1 mmol/L (ref 0.0–2.0)
Bicarbonate: 26.9 mmol/L (ref 20.0–28.0)
Bicarbonate: 27 mmol/L (ref 20.0–28.0)
Calcium, Ion: 1.26 mmol/L (ref 1.15–1.40)
Calcium, Ion: 1.29 mmol/L (ref 1.15–1.40)
HCT: 47 % (ref 39.0–52.0)
HCT: 47 % (ref 39.0–52.0)
Hemoglobin: 16 g/dL (ref 13.0–17.0)
Hemoglobin: 16 g/dL (ref 13.0–17.0)
O2 Saturation: 64 %
O2 Saturation: 65 %
Potassium: 4.5 mmol/L (ref 3.5–5.1)
Potassium: 4.6 mmol/L (ref 3.5–5.1)
Sodium: 140 mmol/L (ref 135–145)
Sodium: 140 mmol/L (ref 135–145)
TCO2: 28 mmol/L (ref 22–32)
TCO2: 28 mmol/L (ref 22–32)
pCO2, Ven: 45.4 mmHg (ref 44.0–60.0)
pCO2, Ven: 45.8 mmHg (ref 44.0–60.0)
pH, Ven: 7.377 (ref 7.250–7.430)
pH, Ven: 7.383 (ref 7.250–7.430)
pO2, Ven: 34 mmHg (ref 32.0–45.0)
pO2, Ven: 35 mmHg (ref 32.0–45.0)

## 2020-09-25 LAB — BASIC METABOLIC PANEL
Anion gap: 9 (ref 5–15)
BUN: 27 mg/dL — ABNORMAL HIGH (ref 8–23)
CO2: 26 mmol/L (ref 22–32)
Calcium: 9.5 mg/dL (ref 8.9–10.3)
Chloride: 102 mmol/L (ref 98–111)
Creatinine, Ser: 1.26 mg/dL — ABNORMAL HIGH (ref 0.61–1.24)
GFR, Estimated: >60 mL/min (ref >60)
Glucose, Bld: 129 mg/dL — ABNORMAL HIGH (ref 70–99)
Potassium: 4.3 mmol/L (ref 3.5–5.1)
Sodium: 137 mmol/L (ref 135–145)

## 2020-09-25 LAB — CBC
HCT: 47 % (ref 39.0–52.0)
Hemoglobin: 14.8 g/dL (ref 13.0–17.0)
MCH: 29 pg (ref 26.0–34.0)
MCHC: 31.5 g/dL (ref 30.0–36.0)
MCV: 92 fL (ref 80.0–100.0)
Platelets: 332 10*3/uL (ref 150–400)
RBC: 5.11 MIL/uL (ref 4.22–5.81)
RDW: 15.1 % (ref 11.5–15.5)
WBC: 10.6 10*3/uL — ABNORMAL HIGH (ref 4.0–10.5)
nRBC: 0 % (ref 0.0–0.2)

## 2020-09-25 LAB — MAGNESIUM: Magnesium: 2.2 mg/dL (ref 1.7–2.4)

## 2020-09-25 SURGERY — RIGHT/LEFT HEART CATH AND CORONARY ANGIOGRAPHY
Anesthesia: LOCAL

## 2020-09-25 MED ORDER — ONDANSETRON HCL 4 MG/2ML IJ SOLN: 4.0000 mg | Freq: Four times a day (QID) | INTRAMUSCULAR | Status: DC | PRN

## 2020-09-25 MED ORDER — HEPARIN SODIUM (PORCINE) 1000 UNIT/ML IJ SOLN
INTRAMUSCULAR | Status: DC | PRN
  Administered 2020-09-25: 4000 [IU] via INTRAVENOUS

## 2020-09-25 MED ORDER — IOHEXOL 350 MG/ML SOLN
INTRAVENOUS | Status: DC | PRN
  Administered 2020-09-25: 50 mL

## 2020-09-25 MED ORDER — SODIUM CHLORIDE 0.9 % IV SOLN
INTRAVENOUS | Status: AC
  Administered 2020-09-25: 50 mL/h via INTRAVENOUS

## 2020-09-25 MED ORDER — HYDRALAZINE HCL 20 MG/ML IJ SOLN: 10.0000 mg | INTRAMUSCULAR | Status: AC | PRN

## 2020-09-25 MED ORDER — HEPARIN (PORCINE) IN NACL 1000-0.9 UT/500ML-% IV SOLN
INTRAVENOUS | Status: DC | PRN
  Administered 2020-09-25 (×2): 500 mL

## 2020-09-25 MED ORDER — FUROSEMIDE 10 MG/ML IJ SOLN
60.0000 mg | Freq: Two times a day (BID) | INTRAMUSCULAR | Status: DC
  Administered 2020-09-25 – 2020-09-26 (×3): 60 mg via INTRAVENOUS
  Filled 2020-09-25 (×3): qty 6

## 2020-09-25 MED ORDER — LIDOCAINE HCL (PF) 1 % IJ SOLN
INTRAMUSCULAR | Status: DC | PRN
  Administered 2020-09-25 (×2): 2 mL

## 2020-09-25 MED ORDER — MIDAZOLAM HCL 2 MG/2ML IJ SOLN
INTRAMUSCULAR | Status: AC
  Filled 2020-09-25: qty 2

## 2020-09-25 MED ORDER — HEPARIN (PORCINE) IN NACL 1000-0.9 UT/500ML-% IV SOLN
INTRAVENOUS | Status: AC
  Filled 2020-09-25: qty 1000

## 2020-09-25 MED ORDER — LIDOCAINE HCL (PF) 1 % IJ SOLN
INTRAMUSCULAR | Status: AC
  Filled 2020-09-25: qty 30

## 2020-09-25 MED ORDER — HEPARIN SODIUM (PORCINE) 1000 UNIT/ML IJ SOLN
INTRAMUSCULAR | Status: AC
  Filled 2020-09-25: qty 1

## 2020-09-25 MED ORDER — LABETALOL HCL 5 MG/ML IV SOLN: 10.0000 mg | INTRAVENOUS | Status: AC | PRN

## 2020-09-25 MED ORDER — MIDAZOLAM HCL 2 MG/2ML IJ SOLN
INTRAMUSCULAR | Status: DC | PRN
  Administered 2020-09-25: 1 mg via INTRAVENOUS

## 2020-09-25 MED ORDER — HEPARIN (PORCINE) IN NACL 2-0.9 UNITS/ML
INTRAMUSCULAR | Status: DC | PRN
  Administered 2020-09-25: 10 mL via INTRA_ARTERIAL

## 2020-09-25 MED ORDER — FENTANYL CITRATE (PF) 100 MCG/2ML IJ SOLN
INTRAMUSCULAR | Status: AC
  Filled 2020-09-25: qty 2

## 2020-09-25 MED ORDER — SODIUM CHLORIDE 0.9% FLUSH
3.0000 mL | Freq: Two times a day (BID) | INTRAVENOUS | Status: DC
  Administered 2020-09-26 – 2020-09-28 (×3): 3 mL via INTRAVENOUS

## 2020-09-25 MED ORDER — VERAPAMIL HCL 2.5 MG/ML IV SOLN
INTRAVENOUS | Status: AC
  Filled 2020-09-25: qty 2

## 2020-09-25 MED ORDER — SODIUM CHLORIDE 0.9% FLUSH: 3.0000 mL | INTRAVENOUS | Status: DC | PRN

## 2020-09-25 MED ORDER — SODIUM CHLORIDE 0.9 % IV SOLN: 250.0000 mL | INTRAVENOUS | Status: DC | PRN

## 2020-09-25 MED ORDER — ENOXAPARIN SODIUM 40 MG/0.4ML IJ SOSY
40.0000 mg | PREFILLED_SYRINGE | INTRAMUSCULAR | Status: DC
  Administered 2020-09-26 – 2020-09-28 (×3): 40 mg via SUBCUTANEOUS
  Filled 2020-09-25 (×3): qty 0.4

## 2020-09-25 MED ORDER — ACETAMINOPHEN 325 MG PO TABS
650.0000 mg | ORAL_TABLET | ORAL | Status: DC | PRN
  Administered 2020-09-25 – 2020-09-28 (×2): 650 mg via ORAL
  Filled 2020-09-25 (×3): qty 2

## 2020-09-25 MED ORDER — FENTANYL CITRATE (PF) 100 MCG/2ML IJ SOLN
INTRAMUSCULAR | Status: DC | PRN
  Administered 2020-09-25: 25 ug via INTRAVENOUS

## 2020-09-25 SURGICAL SUPPLY — 13 items
CATH 5FR JL3.5 JR4 ANG PIG MP (CATHETERS) ×1 IMPLANT
CATH BALLN WEDGE 5F 110CM (CATHETERS) ×1 IMPLANT
DEVICE RAD COMP TR BAND LRG (VASCULAR PRODUCTS) ×1 IMPLANT
GLIDESHEATH SLEND A-KIT 6F 22G (SHEATH) ×1 IMPLANT
KIT HEART LEFT (KITS) ×2 IMPLANT
PACK CARDIAC CATHETERIZATION (CUSTOM PROCEDURE TRAY) ×2 IMPLANT
SHEATH GLIDE SLENDER 4/5FR (SHEATH) ×1 IMPLANT
TRANSDUCER W/STOPCOCK (MISCELLANEOUS) ×2 IMPLANT
TUBING ART PRESS 72  MALE/FEM (TUBING) ×2
TUBING ART PRESS 72 MALE/FEM (TUBING) IMPLANT
TUBING CIL FLEX 10 FLL-RA (TUBING) ×2 IMPLANT
WIRE EMERALD 3MM-J .035X260CM (WIRE) ×1 IMPLANT
WIRE HI TORQ VERSACORE-J 145CM (WIRE) ×1 IMPLANT

## 2020-09-25 NOTE — Progress Notes (Signed)
Heart Failure Stewardship Pharmacist Progress Note   PCP: Pcp, No PCP-Cardiologist: Parke Poisson, MD    HPI:  65 yo M w/ PMH of untreated HTN, tobacco use, HLD, and GERD who presented 6/20 with acute SOB and weakness x2-3 weeks. Wakes up in middle of night with dyspnea and palpitations, noted significant LEE. Hypertensive urgency upon arrival BP 220/145, resolved with IV hydralazine. ECHO revealed EF 20-25%. RHC/LHC revealed nonobstructive CAD, moderate pulmonary HTN, and LVEDP 26, PAP 36, PCWP 21.   Current HF Medications: - Carvedilol 6.25mg  BID - Furosemide 60mg  IV BID - Losartan 50mg  daily - Spironolactone 12.5mg  daily - Potassium BID  Prior to admission HF Medications: - None  Pertinent Lab Values: Serum creatinine 1.26, BUN 27, Potassium 4.3, Sodium 137, BNP 1739, Magnesium 2.2 A1c 6.7  Vital Signs: Weight: 167 lbs (admission weight: 180 lbs) Blood pressure: 130/90s Heart rate: 70s   Medication Assistance / Insurance Benefits Check: Does the patient have prescription insurance?  No  Does the patient qualify for medication assistance through manufacturers or grants?   Yes Eligible grants and/or patient assistance programs: Pending Medication assistance applications in progress: N/A  Medication assistance applications approved: N/A Approved medication assistance renewals will be completed by: Pending  Outpatient Pharmacy:  Prior to admission outpatient pharmacy: [facility] Is the patient willing to use [facility] TOC pharmacy at discharge? Yes Is the patient willing to transition their outpatient pharmacy to utilize a [facility] outpatient pharmacy?   Pending    Assessment: 1. Acute on chronic systolic CHF (EF [facility]), likely due to NICM (uncontrolled HTN). NYHA class III symptoms. - Continue carvedilol 6.25 mg BID - Continue losartan 50 mg daily - Continue spironolactone 12.5 mg daily - Pt requires additional diuresis, increased Lasix to 60mg  IV BID -  Continue potassium 40 mEq BID with increase in Lasix - Once further diuresed, consider adding on Farxiga - Consider changing losartan to [facility] prior to discharge   Plan: 1) Medication changes recommended at this time: - Agree with above changes - Consider Farxiga over the weekend/prior to discharge  2) Patient assistance: - If starting Entresto or SGLT2, can obtain patient assistance  3)  Education  - To be completed prior to discharge - HF [facility] appointment 6/29  [facility]) Barrie Wale, PharmD Student  Please check [email or web address] for unit-specific pharmacist phone numbers

## 2020-09-25 NOTE — CV Procedure (Signed)
Diffuse nonobstructive three-vessel coronary atherosclerosis. Decompensated systolic heart failure, with mean capillary wedge pressure 22 mmHg, LVEDP 26 mmHg. Moderate to severe pulmonary hypertension with mean PA pressure 36 mmHg.  PA diastolic pressure 27 mmHg. PA O2 saturation on 2 L oxygen 65%.  Calculated Fick cardiac output 3.7 L/min with cardiac index 2.0 L/min/m.

## 2020-09-25 NOTE — Progress Notes (Signed)
OT Cancellation Note  Patient Details Name: Juan Kent MRN: [id number] DOB: January 06, 1956   Cancelled Treatment:    Reason Eval/Treat Not Completed: Fatigue/lethargy limiting ability to participate;Other (comment) pt reports fatigue from procedure this AM declining OT session. Will check back as time allows for OT session.  Pollyann Glen K., COTA/L Acute Rehabilitation Services [phone]   Barron Schmid 09/25/2020, 4:03 PM

## 2020-09-25 NOTE — Interval H&P Note (Signed)
Cath Lab Visit (complete for each Cath Lab visit)  Clinical Evaluation Leading to the Procedure:   ACS: No.  Non-ACS:    Anginal Classification: CCS III  Anti-ischemic medical therapy: Minimal Therapy (1 class of medications)  Non-Invasive Test Results: No non-invasive testing performed  Prior CABG: No previous CABG      History and Physical Interval Note:  09/25/2020 7:30 AM  Juan Kent  has presented today for surgery, with the diagnosis of heart failure.  The various methods of treatment have been discussed with the patient and family. After consideration of risks, benefits and other options for treatment, the patient has consented to  Procedure(s): RIGHT/LEFT HEART CATH AND CORONARY ANGIOGRAPHY (N/A) as a surgical intervention.  The patient's history has been reviewed, patient examined, no change in status, stable for surgery.  I have reviewed the patient's chart and labs.  Questions were answered to the patient's satisfaction.     Lyn Records III

## 2020-09-25 NOTE — Plan of Care (Signed)

## 2020-09-25 NOTE — Progress Notes (Signed)
HD#4 Subjective:  Overnight Events: Patient made n.p.o. overnight  Juan Kent is resting in bed comfortably s/p RHC/LHC this morning.  He notes that he is feeling well, in no acute distress.  Does note that he is having difficulty keeping arm straight as requested by cardiology.  He was in April lay flat during the procedure without difficulties but has been sleeping sitting up.  We discussed that there were no occlusions but he did have some coronary artery disease.  Discussed we will continue to work with him to optimize his medications hopefully discharge in the next day or so.  Denied right wrist pain from his procedure, denies numbness or tingling in his fingers.  Believes that his breathing has improved.  Denied cramping, but did not receive Lasix today.  He has no other acute concerns or questions at this time.  Objective:  Vital signs in last 24 hours: Vitals:   09/25/20 0817 09/25/20 0822 09/25/20 0830 09/25/20 0941  BP: (!) 140/98 (!) 138/100  (!) 121/100  Pulse: 83 77 (!) 0   Resp: 14 16 (!) 0 11  Temp:      TempSrc:      SpO2: 98% 98% (!) 0% 95%  Weight:      Height:       Supplemental O2: Nasal Cannula SpO2: 95 % O2 Flow Rate (L/min): 2 L/min  Physical Exam:  Constitutional: well-appearing, resting in bed comfortably HENT: normocephalic atraumatic Eyes: conjunctiva non-erythematous Neck: supple Cardiovascular: regular rate and rhythm, no m/r/g.  No lower extremity edema appreciated Pulmonary/Chest: normal work of breathing on room air, crackles bilateral lung bases MSK: Right wrist wrapped with bandage. Abdominal: Nondistended, soft Neurological: alert & oriented x 3 Skin: warm and dry Psych: Normal mood and affect  Filed Weights   09/23/20 0450 09/24/20 0022 09/25/20 0500  Weight: 76.8 kg 76.3 kg 75.8 kg     Intake/Output Summary (Last 24 hours) at 09/25/2020 1036 Last data filed at 09/25/2020 1026 Gross per 24 hour  Intake 1795.49 ml  Output 3600 ml   Net -1804.51 ml   Net IO Since Admission: -13,294.51 mL [09/25/20 1036]  Pertinent Labs: CBC Latest Ref Rng & Units 09/25/2020 09/24/2020 09/21/2020  WBC 4.0 - 10.5 K/uL 10.6(H) 8.9 10.7(H)  Hemoglobin 13.0 - 17.0 g/dL 40.9 81.1 91.4  Hematocrit 39.0 - 52.0 % 47.0 47.3 50.4  Platelets 150 - 400 K/uL 332 342 348    CMP Latest Ref Rng & Units 09/25/2020 09/24/2020 09/23/2020  Glucose 70 - 99 mg/dL 782(N) 562(Z) 308(M)  BUN 8 - 23 mg/dL 57(Q) 21 21  Creatinine 0.61 - 1.24 mg/dL 4.69(G) 2.95 2.84(X)  Sodium 135 - 145 mmol/L 137 138 141  Potassium 3.5 - 5.1 mmol/L 4.3 4.2 3.4(L)  Chloride 98 - 111 mmol/L 102 103 102  CO2 22 - 32 mmol/L 26 25 29   Calcium 8.9 - 10.3 mg/dL 9.5 9.8 9.4  Total Protein 6.5 - 8.1 g/dL - - 5.7(L)  Total Bilirubin 0.3 - 1.2 mg/dL - - 1.2  Alkaline Phos 38 - 126 U/L - - 97  AST 15 - 41 U/L - - 43(H)  ALT 0 - 44 U/L - - 77(H)    Imaging: CARDIAC CATHETERIZATION  Result Date: 09/25/2020  Diffuse nonobstructive coronary atherosclerosis involving LAD, circumflex, and RCA.  Dominant RCA anatomy.  Nonischemic cardiomyopathy with decompensated left heart failure.  Moderate pulmonary hypertension with mean pressure 36 mmHg.  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  LVEDP 26 mmHg.  Cardiac output based upon a PA saturation of 65% is 3.7 L/min and cardiac index 2.0 L/min/m. RECOMMENDATIONS:  Guideline directed therapy for systolic heart failure.  Abstinence from tobacco and alcohol use.  Still volume overloaded, needs more aggressive diuresis as tolerated by kidney function and blood pressure.   Echocardiogram 09/22/20   1. Left ventricular ejection fraction, by estimation, is 20 to 25%. The  left ventricle has severely decreased function. The left ventricle  demonstrates global hypokinesis. The left ventricular internal cavity size  was mildly dilated. There is mild left  ventricular hypertrophy. Left ventricular diastolic parameters are  consistent with Grade  III diastolic dysfunction (restrictive). Elevated  left atrial pressure.   2. Right ventricular systolic function is mildly reduced. The right  ventricular size is normal. There is moderately elevated pulmonary artery  systolic pressure.   3. Left atrial size was moderately dilated.   4. Right atrial size was moderately dilated.   5. The mitral valve is normal in structure. Mild to moderate mitral valve  regurgitation. No evidence of mitral stenosis.   6. Tricuspid valve regurgitation is moderate.   7. The aortic valve is tricuspid. Aortic valve regurgitation is trivial.  No aortic stenosis is present.   8. The inferior vena cava is dilated in size with <50% respiratory  variability, suggesting right atrial pressure of 15 mmHg.   Assessment/Plan:   Principal Problem:   Acute on chronic HFrEF (heart failure with reduced ejection fraction) (HCC) Active Problems:   Tobacco use disorder   Essential hypertension   Chronic liver disease   Alcohol use disorder   Hypervolemia   Acute pulmonary edema (HCC)   Patient Summary: Juan Kent is a 65 y.o. with a pertinent PMH of PMH of HTN, HLD, tobacco use disorder, who presented with dyspnea on exertion and anasarca and admitted for further evaluation of acute heart failure exacerbation  Acute on chronic HFrEF Exacerbation  Non-Ischemic Cardiomyopathy Nonobstructive coronary Artery Disease Patient s/p RHC,LHC, day 0.  Heart catheterization performed by Juan Kent this morning, appreciate assistance in this patient's care.  He was found to have non-obstructive three-vessel coronary atherosclerosis. Decompensated systolic heart failure mean capillary wedge pressure 22 mmHg.  Suspect his uncontrolled hypertension is contributing to his nonischemic cardiomyopathy. We will medically optimize him and continue diuresing.  Patient is down 0.5 kg last 24 hours, net negative.  Admitting weight of 81.6 kg, weight today of 76.3 kg.  Increase his Lasix today  to 60 mg twice daily. -Cardiology consult in place, appreciate their recommendations and assistance -Increase Lasix 60 mg twice daily -Losartan 50 mg, spironolactone 12.5 daily -Carvedilol 6.25 mg daily -Continue 81 mg aspirin and Lipitor 40 mg daily.   -Plan for Marcelline Deist as well as Entresto at discharge with medication assistance -Daily weight/I&O's -Daily BMP and mag levels, continue Klor-Con 40 mg daily -wean oxygen as tolerable -PT/OT consulted, no recommendations  Pulmonary hypertension Moderate to severe pulmonary hypertension with mean PA pressure 36 mmHg seen on heart catheterization performed today. Also suspect that his pulmonary hypertension is secondary to undiagnosed OSA. Patient with symptoms of waking up in the middle night gasping for air and has difficult time with getting complete nights rest.   Severe Asymptomatic Hypertension Improvement of blood pressure, last reading of 116/69. we will continue with losartan, spironolactone, carvedilol. -Lasix daily as per above -Losartan 50 mg, spironolactone 12.5 daily, carvedilol 6.25 daily   Elevated Liver Enzymes Hyperbilirubinemia Abdominal Distension Mr. Afternoon discussed ultrasound findings of acute liver changes  with patient.  Discussed alcohol cessation.  States that in the evenings he is unable to fall asleep and the alcohol helps with that.  He acknowledges understanding of knowing that he will need to stop alcohol as well as tobacco use.  Encouraged him to reach out to our staff if he is also he needs assistance with these. -Continue to encourage alcohol cessation   Type II diabetes mellitus A1c is 6.7, this is a new diagnosis for this patient.  Glucose is appropriate during his admission.  We will hold off on starting SSI and plan to discuss this prior to his discharge.    Tobacco Use Disorder Discussed medication options at discharge.  Discussed starting Wellbutrin with him yesterday, patient states he will think  about it. -Continue smoking cessation discussions.   Diet: Heart Healthy,  IVF: None,None VTE: Enoxaparin Code: Full PT/OT recs: None, none.  Dispo: Anticipated discharge to Home in 1 to 2 days pending further diuresis and medication optimization  Thalia Bloodgood DO Internal Medicine Resident PGY-1 Pager [phone] Please contact the on call pager after 5 pm and on weekends at [phone].

## 2020-09-25 NOTE — Progress Notes (Signed)
Reviewed cath and hemodynamic data. Concur with continuing diuresis today.  Continue GDMT as tolerate.   Heart Failure Therapy ACE-I/ARB/ARNI: losartan 50 mg daily (Entresto if affordable) BB: carvedilol 6.25 mg BID MRA: spironolactone 12.5 mg daily SGLT2I: would be optimal candidate for [place] or Jardiance, recommend pursuing patient assistance. HbA1c 6.7 % Diuretic - lasix 60 mg IV BID. Significant cramping may be due to diuresis vs statin, likely the former.  Cardiology will continue to follow.   Weston Brass, MD, [facility]  [facility]

## 2020-09-26 LAB — BASIC METABOLIC PANEL
Anion gap: 6 (ref 5–15)
BUN: 27 mg/dL — ABNORMAL HIGH (ref 8–23)
CO2: 26 mmol/L (ref 22–32)
Calcium: 9.1 mg/dL (ref 8.9–10.3)
Chloride: 104 mmol/L (ref 98–111)
Creatinine, Ser: 1.28 mg/dL — ABNORMAL HIGH (ref 0.61–1.24)
GFR, Estimated: >60 mL/min (ref >60)
Glucose, Bld: 166 mg/dL — ABNORMAL HIGH (ref 70–99)
Potassium: 4.3 mmol/L (ref 3.5–5.1)
Sodium: 136 mmol/L (ref 135–145)

## 2020-09-26 LAB — MAGNESIUM: Magnesium: 2.2 mg/dL (ref 1.7–2.4)

## 2020-09-26 MED ORDER — SACUBITRIL-VALSARTAN 24-26 MG PO TABS
1.0000 | ORAL_TABLET | Freq: Two times a day (BID) | ORAL | Status: DC
  Administered 2020-09-26 – 2020-09-28 (×5): 1 via ORAL
  Filled 2020-09-26 (×5): qty 1

## 2020-09-26 MED ORDER — EMPAGLIFLOZIN 10 MG PO TABS
10.0000 mg | ORAL_TABLET | Freq: Every day | ORAL | Status: DC
  Administered 2020-09-26 – 2020-09-28 (×3): 10 mg via ORAL
  Filled 2020-09-26 (×3): qty 1

## 2020-09-26 NOTE — Progress Notes (Signed)
Cardiology Progress Note  Patient ID: Juan Kent MRN: [id number] DOB: 02/07/1956 Date of Encounter: 09/26/2020  Primary Cardiologist: Parke Poisson, MD  Subjective   Chief Complaint: Shortness of breath  HPI: Still volume up.  Elevated EDP at cath.  Reports she is still short of breath.  ROS:  All other ROS reviewed and negative. Pertinent positives noted in the HPI.     Inpatient Medications  Scheduled Meds:  aspirin  81 mg Oral Daily   atorvastatin  40 mg Oral Daily   carvedilol  6.25 mg Oral BID WC   empagliflozin  10 mg Oral Daily   enoxaparin (LOVENOX) injection  40 mg Subcutaneous Q24H   furosemide  60 mg Intravenous BID   potassium chloride  40 mEq Oral BID   sacubitril-valsartan  1 tablet Oral BID   sodium chloride flush  3 mL Intravenous Q12H   spironolactone  12.5 mg Oral Daily   Continuous Infusions:  sodium chloride     sodium chloride     PRN Meds: sodium chloride, sodium chloride, acetaminophen, cyclobenzaprine, ondansetron (ZOFRAN) IV, polyethylene glycol, sodium chloride flush   Vital Signs   Vitals:   09/26/20 0044 09/26/20 0052 09/26/20 0445 09/26/20 0700  BP: (!) 129/92  137/90 118/75  Pulse: 76  73 75  Resp: 18  19 18   Temp: (!) 97.5 F (36.4 C)  (!) 97.3 F (36.3 C) 97.9 F (36.6 C)  TempSrc: Oral  Oral Oral  SpO2: 98%  97% 98%  Weight:  73.6 kg    Height:        Intake/Output Summary (Last 24 hours) at 09/26/2020 0831 Last data filed at 09/26/2020 0720 Gross per 24 hour  Intake 980 ml  Output 4575 ml  Net -3595 ml   Last 3 Weights 09/26/2020 09/25/2020 09/24/2020  Weight (lbs) 162 lb 3.2 oz 167 lb 168 lb 3.2 oz  Weight (kg) 73.573 kg 75.751 kg 76.295 kg      Telemetry  Overnight telemetry shows sinus rhythm in the 70s, which I personally reviewed.   Physical Exam   Vitals:   09/26/20 0044 09/26/20 0052 09/26/20 0445 09/26/20 0700  BP: (!) 129/92  137/90 118/75  Pulse: 76  73 75  Resp: 18  19 18   Temp: (!) 97.5 F (36.4  C)  (!) 97.3 F (36.3 C) 97.9 F (36.6 C)  TempSrc: Oral  Oral Oral  SpO2: 98%  97% 98%  Weight:  73.6 kg    Height:        Intake/Output Summary (Last 24 hours) at 09/26/2020 0831 Last data filed at 09/26/2020 0720 Gross per 24 hour  Intake 980 ml  Output 4575 ml  Net -3595 ml    Last 3 Weights 09/26/2020 09/25/2020 09/24/2020  Weight (lbs) 162 lb 3.2 oz 167 lb 168 lb 3.2 oz  Weight (kg) 73.573 kg 75.751 kg 76.295 kg    Body mass index is 26.18 kg/m.  General: Well nourished, well developed, in no acute distress Head: Atraumatic, normal size  Eyes: PEERLA, EOMI  Neck: Supple, JVD 10 to 12 cm of water Endocrine: No thryomegaly Cardiac: Normal S1, S2; RRR; no murmurs, rubs, or gallops Lungs: Crackles at the lung bases Abd: Soft, nontender, no hepatomegaly  Ext: No edema, pulses 2+ Musculoskeletal: No deformities, BUE and BLE strength normal and equal Skin: Warm and dry, no rashes   Neuro: Alert and oriented to person, place, time, and situation, CNII-XII grossly intact, no focal deficits  Psych: Normal mood  and affect   Labs  High Sensitivity Troponin:   Recent Labs  Lab 09/21/20 1041 09/21/20 1241  TROPONINIHS 78* 67*     Cardiac EnzymesNo results for input(s): TROPONINI in the last 168 hours. No results for input(s): TROPIPOC in the last 168 hours.  Chemistry Recent Labs  Lab 09/21/20 1041 09/22/20 0309 09/23/20 0245 09/24/20 0256 09/25/20 0335 09/25/20 0805 09/25/20 0809 09/25/20 0810 09/26/20 0239  NA 140 140 141 138 137   < > 140 140 136  K 3.4* 3.1* 3.4* 4.2 4.3   < > 4.6 4.5 4.3  CL 105 105 102 103 102  --   --   --  104  CO2 25 26 29 25 26   --   --   --  26  GLUCOSE 109* 150* 119* 117* 129*  --   --   --  166*  BUN 19 18 21 21  27*  --   --   --  27*  CREATININE 1.09 1.07 1.26* 1.23 1.26*  --   --   --  1.28*  CALCIUM 9.1 8.9 9.4 9.8 9.5  --   --   --  9.1  PROT 6.4* 6.0* 5.7*  --   --   --   --   --   --   ALBUMIN 3.3* 3.1* 3.0*  --   --   --   --    --   --   AST 78* 67* 43*  --   --   --   --   --   --   ALT 115* 106* 77*  --   --   --   --   --   --   ALKPHOS 112 112 97  --   --   --   --   --   --   BILITOT 1.6* 1.7* 1.2  --   --   --   --   --   --   GFRNONAA >60 >60 >60 >60 >60  --   --   --  >60  ANIONGAP 10 9 10 10 9   --   --   --  6   < > = values in this interval not displayed.    Hematology Recent Labs  Lab 09/21/20 1041 09/24/20 1958 09/25/20 0335 09/25/20 0805 09/25/20 0809 09/25/20 0810  WBC 10.7* 8.9 10.6*  --   --   --   RBC 5.41 5.17 5.11  --   --   --   HGB 15.6 14.8 14.8 15.6 16.0 16.0  HCT 50.4 47.3 47.0 46.0 47.0 47.0  MCV 93.2 91.5 92.0  --   --   --   MCH 28.8 28.6 29.0  --   --   --   MCHC 31.0 31.3 31.5  --   --   --   RDW 15.0 15.1 15.1  --   --   --   PLT 348 342 332  --   --   --    BNP Recent Labs  Lab 09/21/20 1041  BNP 1,739.7*    DDimer No results for input(s): DDIMER in the last 168 hours.   Radiology  CARDIAC CATHETERIZATION  Result Date: 09/25/2020  Diffuse nonobstructive coronary atherosclerosis involving LAD, circumflex, and RCA.  Dominant RCA anatomy.  Nonischemic cardiomyopathy with decompensated left heart failure.  Moderate pulmonary hypertension with mean pressure 36 mmHg.  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  LVEDP 26 mmHg.  Cardiac  output based upon a PA saturation of 65% is 3.7 L/min and cardiac index 2.0 L/min/m. RECOMMENDATIONS:  Guideline directed therapy for systolic heart failure.  Abstinence from tobacco and alcohol use.  Still volume overloaded, needs more aggressive diuresis as tolerated by kidney function and blood pressure.   Cardiac Studies  TTE 09/22/2020  1. Left ventricular ejection fraction, by estimation, is 20 to 25%. The  left ventricle has severely decreased function. The left ventricle  demonstrates global hypokinesis. The left ventricular internal cavity size  was mildly dilated. There is mild left  ventricular hypertrophy. Left  ventricular diastolic parameters are  consistent with Grade III diastolic dysfunction (restrictive). Elevated  left atrial pressure.   2. Right ventricular systolic function is mildly reduced. The right  ventricular size is normal. There is moderately elevated pulmonary artery  systolic pressure.   3. Left atrial size was moderately dilated.   4. Right atrial size was moderately dilated.   5. The mitral valve is normal in structure. Mild to moderate mitral valve  regurgitation. No evidence of mitral stenosis.   6. Tricuspid valve regurgitation is moderate.   7. The aortic valve is tricuspid. Aortic valve regurgitation is trivial.  No aortic stenosis is present.   8. The inferior vena cava is dilated in size with <50% respiratory  variability, suggesting right atrial pressure of 15 mmHg.   LHC 09/25/2020  Diffuse nonobstructive coronary atherosclerosis involving LAD, circumflex, and RCA.  Dominant RCA anatomy. Nonischemic cardiomyopathy with decompensated left heart failure. Moderate pulmonary hypertension with mean pressure 36 mmHg.  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 LVEDP 26 mmHg. Cardiac output based upon a PA saturation of 65% is 3.7 L/min and cardiac index 2.0 L/min/m.   RECOMMENDATIONS:   Guideline directed therapy for systolic heart failure. Abstinence from tobacco and alcohol use. Still volume overloaded, needs more aggressive diuresis as tolerated by kidney function and blood pressure.   Patient Profile  Clark Cuff is a 65 y.o. male with hypertension, tobacco abuse, alcohol abuse, GERD who was admitted on 09/21/2020 for new onset systolic heart failure.  Assessment & Plan   New onset systolic heart failure, EF 20-25% -Left heart cath with nonobstructive CAD.  This is a nonischemic cardiomyopathy.  Possibly related to hypertension versus alcohol abuse.  He reports significant alcohol abuse on my interview.  No drug use reported. -LVEDP 26 mmHg.  Still volume up on  my exam.  Hemodynamic show he is not in shock. -Continue 60 mg IV Lasix twice daily.  Anticipate transition to oral Lasix tomorrow if good urine output.  Aim for 3 to 5 L of output. -Stop losartan.  Start Entresto 24-26 mg twice daily.  Start Jardiance 10 mg daily.  Please have pharmacy fill out patient assistance forms for him. -Continue Coreg 6.25 mg twice daily.  On Aldactone 12.5 mg daily. -He will need to refrain from tobacco use as well as alcohol use at discharge. -Anticipate transition to oral Lasix tomorrow.  2.  New onset diabetes -Started Jardiance as above.  3.  Alcohol use disorder -Could be contributing to cardiomyopathy.  Liver enzymes have trended down.  Cessation advised.  4.  Nonobstructive CAD -Aspirin and statin recommended.  For questions or updates, please contact [facility] Please consult [email or web address] for contact info under   Time Spent with Patient: I have spent a total of 35 minutes with patient reviewing hospital notes, telemetry, EKGs, labs and examining the patient as well as establishing an assessment and plan that was  discussed with the patient.  > 50% of time was spent in direct patient care.    Signed, Lenna Gilford. Flora Lipps, MD, [facility]  [facility]  09/26/2020 8:31 AM

## 2020-09-26 NOTE — Progress Notes (Addendum)
HD#5 Subjective:  Overnight Events: No acute events overnight  Juan Kent is resting in bed comfortably.  He states that the soreness in his right wrist has improved.  He notes that his shortness of breath is improved as well and he was able to ambulate around the room without difficulty when he is not tied down from the many wires.  Notes that in the evening times he wakes up feeling short of breath and panics unable to fall back asleep.  Discussed that this sounds like sleep apnea and he will need a sleep study on outpatient basis to evaluate for this.  Discussed with patient that we can talk about this at his follow-up appointment in our clinic once he is discharged.   Objective:  Vital signs in last 24 hours: Vitals:   09/26/20 0052 09/26/20 0445 09/26/20 0700 09/26/20 1100  BP:  137/90 118/75 115/69  Pulse:  73 75 69  Resp:  19 18 19   Temp:  (!) 97.3 F (36.3 C) 97.9 F (36.6 C) 97.8 F (36.6 C)  TempSrc:  Oral Oral Oral  SpO2:  97% 98% 97%  Weight: 73.6 kg     Height:       Supplemental O2: Nasal Cannula SpO2: 97 % O2 Flow Rate (L/min): 2 L/min  Physical Exam:  Constitutional: well-appearing, resting in bed comfortably HENT: normocephalic atraumatic Eyes: conjunctiva non-erythematous Neck: supple Cardiovascular: regular rate and rhythm, no m/r/g.  No lower extremity edema appreciated Pulmonary/Chest: normal work of breathing on room air MSK: Right wrist, site of catheterization, nontender to palpation Neurological: alert & oriented x 3 Skin: warm and dry Psych: Normal mood and affect  Filed Weights   09/24/20 0022 09/25/20 0500 09/26/20 0052  Weight: 76.3 kg 75.8 kg 73.6 kg     Intake/Output Summary (Last 24 hours) at 09/26/2020 1325 Last data filed at 09/26/2020 1253 Gross per 24 hour  Intake 500 ml  Output 4750 ml  Net -4250 ml    Net IO Since Admission: -18,204.51 mL [09/26/20 1325]  Pertinent Labs: CBC Latest Ref Rng & Units 09/25/2020 09/25/2020  09/25/2020  WBC 4.0 - 10.5 K/uL - - -  Hemoglobin 13.0 - 17.0 g/dL 09/27/2020 18.5 63.1  Hematocrit 39.0 - 52.0 % 47.0 47.0 46.0  Platelets 150 - 400 K/uL - - -    CMP Latest Ref Rng & Units 09/26/2020 09/25/2020 09/25/2020  Glucose 70 - 99 mg/dL 09/27/2020) - -  BUN 8 - 23 mg/dL 026(V) - -  Creatinine 78(H - 1.24 mg/dL 8.85) - -  Sodium 0.27(X - 145 mmol/L 136 140 140  Potassium 3.5 - 5.1 mmol/L 4.3 4.5 4.6  Chloride 98 - 111 mmol/L 104 - -  CO2 22 - 32 mmol/L 26 - -  Calcium 8.9 - 10.3 mg/dL 9.1 - -  Total Protein 6.5 - 8.1 g/dL - - -  Total Bilirubin 0.3 - 1.2 mg/dL - - -  Alkaline Phos 38 - 126 U/L - - -  AST 15 - 41 U/L - - -  ALT 0 - 44 U/L - - -    Imaging: No results found.  Echocardiogram 09/22/20   1. Left ventricular ejection fraction, by estimation, is 20 to 25%. The  left ventricle has severely decreased function. The left ventricle  demonstrates global hypokinesis. The left ventricular internal cavity size  was mildly dilated. There is mild left  ventricular hypertrophy. Left ventricular diastolic parameters are  consistent with Grade III diastolic dysfunction (restrictive). Elevated  left atrial pressure.   2. Right ventricular systolic function is mildly reduced. The right  ventricular size is normal. There is moderately elevated pulmonary artery  systolic pressure.   3. Left atrial size was moderately dilated.   4. Right atrial size was moderately dilated.   5. The mitral valve is normal in structure. Mild to moderate mitral valve  regurgitation. No evidence of mitral stenosis.   6. Tricuspid valve regurgitation is moderate.   7. The aortic valve is tricuspid. Aortic valve regurgitation is trivial.  No aortic stenosis is present.   8. The inferior vena cava is dilated in size with <50% respiratory  variability, suggesting right atrial pressure of 15 mmHg.   Assessment/Plan:   Principal Problem:   Acute on chronic HFrEF (heart failure with reduced ejection fraction)  (HCC) Active Problems:   Tobacco use disorder   Essential hypertension   Chronic liver disease   Alcohol use disorder   Hypervolemia   Acute pulmonary edema (HCC)   Nonischemic cardiomyopathy (HCC)   Nonobstructive atherosclerosis of coronary artery   Pulmonary hypertension (HCC)   Patient Summary: Juan Kent is a 65 y.o. with a pertinent PMH of PMH of HTN, HLD, tobacco use disorder, who presented with dyspnea on exertion and anasarca and admitted for further evaluation of acute heart failure exacerbation  Acute on chronic HFrEF Exacerbation  Non-Ischemic Cardiomyopathy Nonobstructive coronary Artery Disease Patient s/p RHC,LHC, day 1.  Suspect his uncontrolled hypertension is contributing to his nonischemic cardiomyopathy. We will medically optimize him and continue diuresing.  Patient is down 0.5 kg last 24 hours, net negative.  Admitting weight of 81.6 kg, weight today of 73.6 kg.  Plan to transition the patient to p.o. Lasix tomorrow possible discharge tomorrow or the following day. -Cardiology consult in place, appreciate their recommendations and assistance -Continue Lasix 60 mg twice daily -Losartan 50 mg, spironolactone 12.5 daily -Carvedilol 6.25 mg daily -Continue 81 mg aspirin and Lipitor 40 mg daily.   -Plan for Marcelline Deist as well as Entresto at discharge with medication assistance.  If patient unable to afford these medications or continue them after medication assistance, may need to continue on losartan and SGLT2. -Daily weight/I&O's -Daily BMP and mag levels, continue Klor-Con 40 mg daily -wean oxygen as tolerable -PT/OT consulted, no recommendations  Pulmonary hypertension Moderate to severe pulmonary hypertension with mean PA pressure 36 mmHg seen on heart catheterization performed today. Also suspect that his pulmonary hypertension is secondary to undiagnosed OSA. Patient with symptoms of waking up in the middle night gasping for air and has difficult time with getting  complete nights rest.  Will need outpatient follow-up for OSA work-up and sleep study.  Severe Asymptomatic Hypertension Blood pressure 115/69.  We will continue regimen as per below -Lasix daily as per above -Losartan 50 mg, spironolactone 12.5 daily, carvedilol 6.25 daily   Elevated Liver Enzymes Hyperbilirubinemia Abdominal Distension Continue to discuss alcohol cessation   Type II diabetes mellitus A1c is 6.7, this is a new diagnosis for this patient.  Glucose is appropriate during his admission.  We will hold off on starting SSI and plan to discuss this prior to his discharge.  Plan for SGLT2 on discharge   Tobacco Use Disorder Will discuss discharging home on Wellbutrin. -Continue smoking cessation discussions.   Diet: Heart Healthy,  IVF: None,None VTE: Enoxaparin Code: Full PT/OT recs: None, none.  Dispo: Anticipated discharge to Home in 1 to 2 days Thalia Bloodgood DO Internal Medicine Resident PGY-1 Pager [phone] Please contact  the on call pager after 5 pm and on weekends at [phone].

## 2020-09-27 LAB — BASIC METABOLIC PANEL
Anion gap: 12 (ref 5–15)
BUN: 25 mg/dL — ABNORMAL HIGH (ref 8–23)
CO2: 20 mmol/L — ABNORMAL LOW (ref 22–32)
Calcium: 9.3 mg/dL (ref 8.9–10.3)
Chloride: 104 mmol/L (ref 98–111)
Creatinine, Ser: 1.32 mg/dL — ABNORMAL HIGH (ref 0.61–1.24)
GFR, Estimated: >60 mL/min (ref >60)
Glucose, Bld: 127 mg/dL — ABNORMAL HIGH (ref 70–99)
Potassium: 4.9 mmol/L (ref 3.5–5.1)
Sodium: 136 mmol/L (ref 135–145)

## 2020-09-27 LAB — MAGNESIUM: Magnesium: 2.5 mg/dL — ABNORMAL HIGH (ref 1.7–2.4)

## 2020-09-27 MED ORDER — FUROSEMIDE 40 MG PO TABS
40.0000 mg | ORAL_TABLET | Freq: Every day | ORAL | Status: DC
  Administered 2020-09-27 – 2020-09-28 (×2): 40 mg via ORAL
  Filled 2020-09-27 (×2): qty 1

## 2020-09-27 MED ORDER — DIGOXIN 125 MCG PO TABS
0.1250 mg | ORAL_TABLET | Freq: Every day | ORAL | Status: DC
  Administered 2020-09-27 – 2020-09-28 (×2): 0.125 mg via ORAL
  Filled 2020-09-27 (×2): qty 1

## 2020-09-27 NOTE — Progress Notes (Addendum)
HD#6 Subjective:  Overnight Events: No acute events overnight  Juan Kent states he is doing well today. He did have some cramping in his hands earlier but it has resolved. He was worried that his blood pressure was on the lower side this morning, but denies any symptoms. He would like to make sure he is able to walk around more before discharge.   Objective:  Vital signs in last 24 hours: Vitals:   09/26/20 2005 09/27/20 0000 09/27/20 0400 09/27/20 0500  BP: 125/81 135/86 90/66   Pulse: 72 71 70   Resp: (!) 23 17 18    Temp: 98.1 F (36.7 C) 98 F (36.7 C) 98.2 F (36.8 C)   TempSrc: Oral Oral Oral   SpO2: 97% 97% 96%   Weight:    72.3 kg  Height:       Supplemental O2: None  Physical Exam:  Constitutional: well-appearing, resting in bed comfortably HENT: normocephalic atraumatic Eyes: conjunctiva non-erythematous Neck: supple Cardiovascular: regular rate and rhythm, no m/r/g.  No lower extremity edema appreciated Pulmonary/Chest: Bilateral fine crackles up to mid lung fields bilaterally. No wheezing or rhonchi. Normal work of breathing at rest. No tachypnea.  Neurological: alert & oriented x 3 Skin: warm and dry Psych: Normal mood and affect  Filed Weights   09/25/20 0500 09/26/20 0052 09/27/20 0500  Weight: 75.8 kg 73.6 kg 72.3 kg    Intake/Output Summary (Last 24 hours) at 09/27/2020 0744 Last data filed at 09/27/2020 0600 Gross per 24 hour  Intake 600 ml  Output 5025 ml  Net -4425 ml    Net IO Since Admission: -21,494.51 mL [09/27/20 0744]  Pertinent Labs: CBC Latest Ref Rng & Units 09/25/2020 09/25/2020 09/25/2020  WBC 4.0 - 10.5 K/uL - - -  Hemoglobin 13.0 - 17.0 g/dL 09/27/2020 03.4 74.2  Hematocrit 39.0 - 52.0 % 47.0 47.0 46.0  Platelets 150 - 400 K/uL - - -    CMP Latest Ref Rng & Units 09/27/2020 09/26/2020 09/25/2020  Glucose 70 - 99 mg/dL 09/27/2020) 638(V) -  BUN 8 - 23 mg/dL 564(P) 32(R) -  Creatinine 0.61 - 1.24 mg/dL 51(O) 8.41(Y) -  Sodium 135 - 145  mmol/L 136 136 140  Potassium 3.5 - 5.1 mmol/L 4.9 4.3 4.5  Chloride 98 - 111 mmol/L 104 104 -  CO2 22 - 32 mmol/L 20(L) 26 -  Calcium 8.9 - 10.3 mg/dL 9.3 9.1 -  Total Protein 6.5 - 8.1 g/dL - - -  Total Bilirubin 0.3 - 1.2 mg/dL - - -  Alkaline Phos 38 - 126 U/L - - -  AST 15 - 41 U/L - - -  ALT 0 - 44 U/L - - -   Echocardiogram 09/22/20   1. Left ventricular ejection fraction, by estimation, is 20 to 25%. The  left ventricle has severely decreased function. The left ventricle  demonstrates global hypokinesis. The left ventricular internal cavity size  was mildly dilated. There is mild left  ventricular hypertrophy. Left ventricular diastolic parameters are  consistent with Grade III diastolic dysfunction (restrictive). Elevated  left atrial pressure.   2. Right ventricular systolic function is mildly reduced. The right  ventricular size is normal. There is moderately elevated pulmonary artery  systolic pressure.   3. Left atrial size was moderately dilated.   4. Right atrial size was moderately dilated.   5. The mitral valve is normal in structure. Mild to moderate mitral valve  regurgitation. No evidence of mitral stenosis.   6.  Tricuspid valve regurgitation is moderate.   7. The aortic valve is tricuspid. Aortic valve regurgitation is trivial.  No aortic stenosis is present.   8. The inferior vena cava is dilated in size with <50% respiratory  variability, suggesting right atrial pressure of 15 mmHg.   Assessment/Plan:   Principal Problem:   Acute on chronic HFrEF (heart failure with reduced ejection fraction) (HCC) Active Problems:   Tobacco use disorder   Essential hypertension   Chronic liver disease   Alcohol use disorder   Hypervolemia   Acute pulmonary edema (HCC)   Nonischemic cardiomyopathy (HCC)   Nonobstructive atherosclerosis of coronary artery   Pulmonary hypertension (HCC)   Patient Summary: Juan Kent is a 65 y.o. with a pertinent PMH of PMH of HTN,  HLD, tobacco use disorder, who presented with dyspnea on exertion and anasarca and admitted for further evaluation of acute heart failure exacerbation  Acute on chronic HFrEF Exacerbation  Non-Ischemic Cardiomyopathy Nonobstructive coronary Artery Disease HFrEF in the setting of long-standing untreated hypertension.   -Cardiology consult in place, appreciate their recommendations and assistance -Continue Lasix 60 mg IV twice daily. Consider transition to oral Lasix today  -Continue Entresto 49-51 mg BID, spironolactone 12.5 daily, Carvedilol 6.25 mg daily -Continue 81 mg aspirin  -Continue Lipitor 40 mg daily.   -Jardiance 10 mg daily -Will need to arrange medication assistance. If patient unable to afford these medications or continue them after medication assistance, may need to switch back to Losartan. -Daily weight/I&O's -Daily BMP and Mg while on IV Lasix; Replenish PRN to maintain K > 4.0 and Mg > 2.0 -PT/OT  Pulmonary hypertension Moderate to severe pulmonary hypertension with mean PA pressure 36 mmHg seen on heart catheterization. Likely Type 3 given possible undiagnosed OSA versus Type 2 from heart failure. Will need outpatient sleep study.  Severe Asymptomatic Hypertension Blood pressure on the low end of normal this AM, however improved to 121/90 when manually checked on evaluation. Coreg was held this AM. Will continue with current regimen and monitor closely.    -Lasix daily as per above -Continue Entresto 49-51 mg BID, spironolactone 12.5 daily, Carvedilol 6.25 mg daily   Elevated Liver Enzymes Hyperbilirubinemia Abdominal Distension Continue to discuss alcohol cessation   Type II diabetes mellitus A1c is 6.7, this is a new diagnosis for this patient.  Glucose is appropriate during his admission.  We will hold off on starting SSI and plan to discuss this prior to his discharge.    -Jardiance 10 mg daily   Tobacco Use Disorder Will discuss discharging home on  Wellbutrin.  -Continue smoking cessation discussions   Diet: Heart Healthy,  IVF: None,None VTE: Enoxaparin Code: Full PT/OT recs: None, none.  Dispo: Anticipated discharge to Home in 0 to 1 days  Dr. Verdene Lennert Internal Medicine PGY-2  Pager: [phone] After 5pm on weekdays and 1pm on weekends: On Call pager [phone]  09/27/2020, 7:44 AM

## 2020-09-27 NOTE — Progress Notes (Signed)
Cardiology Progress Note  Patient ID: Juan Kent MRN: [id number] DOB: [birth year] Date of Encounter: 09/27/2020  Primary Cardiologist: Parke Poisson, MD  Subjective   Chief Complaint: None.  HPI: BP soft and starting Entresto.  Net -4.6 L.  Appears euvolemic to me.  ROS:  All other ROS reviewed and negative. Pertinent positives noted in the HPI.     Inpatient Medications  Scheduled Meds:  aspirin  81 mg Oral Daily   atorvastatin  40 mg Oral Daily   carvedilol  6.25 mg Oral BID WC   digoxin  0.125 mg Oral Daily   empagliflozin  10 mg Oral Daily   enoxaparin (LOVENOX) injection  40 mg Subcutaneous Q24H   furosemide  40 mg Oral Daily   sacubitril-valsartan  1 tablet Oral BID   sodium chloride flush  3 mL Intravenous Q12H   spironolactone  12.5 mg Oral Daily   Continuous Infusions:  sodium chloride     sodium chloride     PRN Meds: sodium chloride, sodium chloride, acetaminophen, cyclobenzaprine, ondansetron (ZOFRAN) IV, polyethylene glycol, sodium chloride flush   Vital Signs   Vitals:   09/26/20 2005 09/27/20 0000 09/27/20 0400 09/27/20 0500  BP: 125/81 135/86 90/66   Pulse: 72 71 70   Resp: (!) 23 17 18    Temp: 98.1 F (36.7 C) 98 F (36.7 C) 98.2 F (36.8 C)   TempSrc: Oral Oral Oral   SpO2: 97% 97% 96%   Weight:    72.3 kg  Height:        Intake/Output Summary (Last 24 hours) at 09/27/2020 0908 Last data filed at 09/27/2020 0600 Gross per 24 hour  Intake 360 ml  Output 5025 ml  Net -4665 ml   Last 3 Weights 09/27/2020 09/26/2020 09/25/2020  Weight (lbs) 159 lb 6.4 oz 162 lb 3.2 oz 167 lb  Weight (kg) 72.303 kg 73.573 kg 75.751 kg      Telemetry  Overnight telemetry shows sinus rhythm in the 80s, which I personally reviewed.   Physical Exam   Vitals:   09/26/20 2005 09/27/20 0000 09/27/20 0400 09/27/20 0500  BP: 125/81 135/86 90/66   Pulse: 72 71 70   Resp: (!) 23 17 18    Temp: 98.1 F (36.7 C) 98 F (36.7 C) 98.2 F (36.8 C)   TempSrc:  Oral Oral Oral   SpO2: 97% 97% 96%   Weight:    72.3 kg  Height:        Intake/Output Summary (Last 24 hours) at 09/27/2020 0908 Last data filed at 09/27/2020 0600 Gross per 24 hour  Intake 360 ml  Output 5025 ml  Net -4665 ml    Last 3 Weights 09/27/2020 09/26/2020 09/25/2020  Weight (lbs) 159 lb 6.4 oz 162 lb 3.2 oz 167 lb  Weight (kg) 72.303 kg 73.573 kg 75.751 kg    Body mass index is 25.73 kg/m.  General: Well nourished, well developed, in no acute distress Head: Atraumatic, normal size  Eyes: PEERLA, EOMI  Neck: Supple, JVD 7 8 cm of water Endocrine: No thryomegaly Cardiac: Normal S1, S2; RRR; no murmurs, rubs, or gallops Lungs: Crackles at the lung bases Abd: Soft, nontender, no hepatomegaly  Ext: No edema, pulses 2+ Musculoskeletal: No deformities, BUE and BLE strength normal and equal Skin: Warm and dry, no rashes   Neuro: Alert and oriented to person, place, time, and situation, CNII-XII grossly intact, no focal deficits  Psych: Normal mood and affect   Labs  High Sensitivity Troponin:  Recent Labs  Lab 09/21/20 1041 09/21/20 1241  TROPONINIHS 78* 67*     Cardiac EnzymesNo results for input(s): TROPONINI in the last 168 hours. No results for input(s): TROPIPOC in the last 168 hours.  Chemistry Recent Labs  Lab 09/21/20 1041 09/22/20 0309 09/23/20 0245 09/24/20 0256 09/25/20 0335 09/25/20 0805 09/25/20 0810 09/26/20 0239 09/27/20 0347  NA 140 140 141   < > 137   < > 140 136 136  K 3.4* 3.1* 3.4*   < > 4.3   < > 4.5 4.3 4.9  CL 105 105 102   < > 102  --   --  104 104  CO2 25 26 29    < > 26  --   --  26 20*  GLUCOSE 109* 150* 119*   < > 129*  --   --  166* 127*  BUN 19 18 21    < > 27*  --   --  27* 25*  CREATININE 1.09 1.07 1.26*   < > 1.26*  --   --  1.28* 1.32*  CALCIUM 9.1 8.9 9.4   < > 9.5  --   --  9.1 9.3  PROT 6.4* 6.0* 5.7*  --   --   --   --   --   --   ALBUMIN 3.3* 3.1* 3.0*  --   --   --   --   --   --   AST 78* 67* 43*  --   --   --   --    --   --   ALT 115* 106* 77*  --   --   --   --   --   --   ALKPHOS 112 112 97  --   --   --   --   --   --   BILITOT 1.6* 1.7* 1.2  --   --   --   --   --   --   GFRNONAA >60 >60 >60   < > >60  --   --  >60 >60  ANIONGAP 10 9 10    < > 9  --   --  6 12   < > = values in this interval not displayed.    Hematology Recent Labs  Lab 09/21/20 1041 09/24/20 1958 09/25/20 0335 09/25/20 0805 09/25/20 0809 09/25/20 0810  WBC 10.7* 8.9 10.6*  --   --   --   RBC 5.41 5.17 5.11  --   --   --   HGB 15.6 14.8 14.8 15.6 16.0 16.0  HCT 50.4 47.3 47.0 46.0 47.0 47.0  MCV 93.2 91.5 92.0  --   --   --   MCH 28.8 28.6 29.0  --   --   --   MCHC 31.0 31.3 31.5  --   --   --   RDW 15.0 15.1 15.1  --   --   --   PLT 348 342 332  --   --   --    BNP Recent Labs  Lab 09/21/20 1041  BNP 1,739.7*    DDimer No results for input(s): DDIMER in the last 168 hours.   Radiology  No results found.  Cardiac Studies  TTE 09/22/2020  1. Left ventricular ejection fraction, by estimation, is 20 to 25%. The  left ventricle has severely decreased function. The left ventricle  demonstrates global hypokinesis. The left ventricular internal cavity size  was mildly dilated. There  is mild left  ventricular hypertrophy. Left ventricular diastolic parameters are  consistent with Grade III diastolic dysfunction (restrictive). Elevated  left atrial pressure.   2. Right ventricular systolic function is mildly reduced. The right  ventricular size is normal. There is moderately elevated pulmonary artery  systolic pressure.   3. Left atrial size was moderately dilated.   4. Right atrial size was moderately dilated.   5. The mitral valve is normal in structure. Mild to moderate mitral valve  regurgitation. No evidence of mitral stenosis.   6. Tricuspid valve regurgitation is moderate.   7. The aortic valve is tricuspid. Aortic valve regurgitation is trivial.  No aortic stenosis is present.   8. The inferior vena cava  is dilated in size with <50% respiratory  variability, suggesting right atrial pressure of 15 mmHg.   LHC/RHC 09/25/2020  Diffuse nonobstructive coronary atherosclerosis involving LAD, circumflex, and RCA.  Dominant RCA anatomy. Nonischemic cardiomyopathy with decompensated left heart failure. Moderate pulmonary hypertension with mean pressure 36 mmHg.  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 LVEDP 26 mmHg. Cardiac output based upon a PA saturation of 65% is 3.7 L/min and cardiac index 2.0 L/min/m.   RECOMMENDATIONS:   Guideline directed therapy for systolic heart failure. Abstinence from tobacco and alcohol use. Still volume overloaded, needs more aggressive diuresis as tolerated by kidney function and blood pressure.   Patient Profile  Juan Kent is a 65 y.o. male with hypertension, tobacco abuse, alcohol abuse, GERD who was admitted on 09/21/2020 for new onset systolic heart failure.  Assessment & Plan   New onset systolic heart failure, EF 20-25%, nonischemic -Good diuresis overnight.  Net -4.6 L.  Net -21.4 L since admission. -He appears euvolemic to me. -Left heart cath with nonobstructive CAD.  LVEDP was severely elevated at 26 mmHg. -Transition to 40 mg p.o. Lasix daily. -He is on Coreg 6.25 mg twice daily, Entresto 24-26 mg twice daily, Aldactone 12.5 mg daily, Jardiance 10 mg daily.  Please have pharmacy fill out patient assistance forms. -BP 90/60.  No symptoms.  We will add digoxin 0.125 mg daily so he can tolerate guideline directed medical therapy. -Suspect low BP is related to rapid fluid shift yesterday.  Hopefully backing off on diuresis will also increase his blood pressure. -Suspect alcohol abuse was contributing to this.  Cessation advised.  Also really was not taking any medications or seeing physicians. -Anticipate discharge tomorrow as long as BP is stable and does well on oral diuretics.  2.  New onset diabetes -Jardiance 10 mg daily.  3.  Alcohol use  disorder -Cessation advised.  For questions or updates, please contact [facility] Please consult [email or web address] for contact info under   Time Spent with Patient: I have spent a total of 35 minutes with patient reviewing hospital notes, telemetry, EKGs, labs and examining the patient as well as establishing an assessment and plan that was discussed with the patient.  > 50% of time was spent in direct patient care.    Signed, Lenna Gilford. Flora Lipps, MD, [facility]  [facility]  09/27/2020 9:08 AM

## 2020-09-27 NOTE — Progress Notes (Signed)
Patient describing "itchiness from the inside" that makes him feel jittery and anxious.  Patient stated he has felt this way before after taking antihistamines. Only Entresto given tonight. VSS. MD notified and stated to monitor and if continues to notify.

## 2020-09-28 ENCOUNTER — Other Ambulatory Visit (HOSPITAL_COMMUNITY): Payer: Self-pay

## 2020-09-28 DIAGNOSIS — I2510 Atherosclerotic heart disease of native coronary artery without angina pectoris: Secondary | ICD-10-CM

## 2020-09-28 DIAGNOSIS — I10 Essential (primary) hypertension: Secondary | ICD-10-CM

## 2020-09-28 DIAGNOSIS — J810 Acute pulmonary edema: Secondary | ICD-10-CM

## 2020-09-28 DIAGNOSIS — F1010 Alcohol abuse, uncomplicated: Secondary | ICD-10-CM

## 2020-09-28 LAB — BASIC METABOLIC PANEL
Anion gap: 7 (ref 5–15)
BUN: 21 mg/dL (ref 8–23)
CO2: 26 mmol/L (ref 22–32)
Calcium: 9.3 mg/dL (ref 8.9–10.3)
Chloride: 102 mmol/L (ref 98–111)
Creatinine, Ser: 1.43 mg/dL — ABNORMAL HIGH (ref 0.61–1.24)
GFR, Estimated: 55 mL/min — ABNORMAL LOW (ref >60)
Glucose, Bld: 114 mg/dL — ABNORMAL HIGH (ref 70–99)
Potassium: 4.5 mmol/L (ref 3.5–5.1)
Sodium: 135 mmol/L (ref 135–145)

## 2020-09-28 LAB — MAGNESIUM: Magnesium: 2.4 mg/dL (ref 1.7–2.4)

## 2020-09-28 MED ORDER — DIGOXIN 125 MCG PO TABS
0.1250 mg | ORAL_TABLET | Freq: Every day | ORAL | 0 refills | Status: DC
  Filled 2020-09-28: qty 30, 30d supply, fill #0

## 2020-09-28 MED ORDER — SACUBITRIL-VALSARTAN 24-26 MG PO TABS
1.0000 | ORAL_TABLET | Freq: Two times a day (BID) | ORAL | 0 refills | Status: DC
  Filled 2020-09-28: qty 60, 30d supply, fill #0

## 2020-09-28 MED ORDER — EMPAGLIFLOZIN 10 MG PO TABS
10.0000 mg | ORAL_TABLET | Freq: Every day | ORAL | 0 refills | Status: DC
  Filled 2020-09-28: qty 14, 14d supply, fill #0

## 2020-09-28 MED ORDER — CARVEDILOL 6.25 MG PO TABS
6.2500 mg | ORAL_TABLET | Freq: Two times a day (BID) | ORAL | 0 refills | Status: DC
  Filled 2020-09-28: qty 60, 30d supply, fill #0

## 2020-09-28 MED ORDER — SPIRONOLACTONE 25 MG PO TABS
12.5000 mg | ORAL_TABLET | Freq: Every day | ORAL | 0 refills | Status: DC
  Filled 2020-09-28: qty 30, 30d supply, fill #0

## 2020-09-28 MED ORDER — ASPIRIN 81 MG PO CHEW
81.0000 mg | CHEWABLE_TABLET | Freq: Every day | ORAL | 0 refills | Status: DC
  Filled 2020-09-28: qty 30, 30d supply, fill #0

## 2020-09-28 MED ORDER — FUROSEMIDE 40 MG PO TABS
40.0000 mg | ORAL_TABLET | Freq: Every day | ORAL | 0 refills | Status: DC
  Filled 2020-09-28: qty 30, 30d supply, fill #0

## 2020-09-28 MED ORDER — ATORVASTATIN CALCIUM 40 MG PO TABS
40.0000 mg | ORAL_TABLET | Freq: Every day | ORAL | 0 refills | Status: DC
  Filled 2020-09-28: qty 30, 30d supply, fill #0

## 2020-09-28 MED ORDER — BUPROPION HCL ER (SR) 150 MG PO TB12
150.0000 mg | ORAL_TABLET | Freq: Every day | ORAL | 0 refills | Status: DC
  Filled 2020-09-28: qty 30, 30d supply, fill #0

## 2020-09-28 NOTE — Progress Notes (Addendum)
Progress Note  Patient Name: Juan Kent Date of Encounter: 09/28/2020  [facility] Cardiologist: Parke Poisson, MD   Subjective   Feeling well.  Still some orthopnea.  Also reports that the overnight nurse told him he has OSA.   Inpatient Medications    Scheduled Meds:  aspirin  81 mg Oral Daily   atorvastatin  40 mg Oral Daily   carvedilol  6.25 mg Oral BID WC   digoxin  0.125 mg Oral Daily   empagliflozin  10 mg Oral Daily   enoxaparin (LOVENOX) injection  40 mg Subcutaneous Q24H   furosemide  40 mg Oral Daily   sacubitril-valsartan  1 tablet Oral BID   sodium chloride flush  3 mL Intravenous Q12H   spironolactone  12.5 mg Oral Daily   Continuous Infusions:  sodium chloride     sodium chloride     PRN Meds: sodium chloride, sodium chloride, acetaminophen, cyclobenzaprine, ondansetron (ZOFRAN) IV, polyethylene glycol, sodium chloride flush   Vital Signs    Vitals:   09/27/20 1940 09/27/20 2102 09/27/20 2331 09/28/20 0442  BP: 107/71 (!) 120/97 (!) 131/92 (!) 127/97  Pulse: 77  79   Resp: 18  18 10   Temp: 98 F (36.7 C)  97.8 F (36.6 C) 98.2 F (36.8 C)  TempSrc: Oral  Oral Oral  SpO2: 91%  93% 99%  Weight:    72.5 kg  Height:        Intake/Output Summary (Last 24 hours) at 09/28/2020 0957 Last data filed at 09/28/2020 0901 Gross per 24 hour  Intake 1203 ml  Output 3150 ml  Net -1947 ml   Last 3 Weights 09/28/2020 09/27/2020 09/26/2020  Weight (lbs) 159 lb 14.4 oz 159 lb 6.4 oz 162 lb 3.2 oz  Weight (kg) 72.53 kg 72.303 kg 73.573 kg      Telemetry    Sinus rhythm.  No events - Personally Reviewed  ECG    N/a- Personally Reviewed  Physical Exam   VS:  BP (!) 127/97 (BP Location: Right Arm)   Pulse 79   Temp 98.2 F (36.8 C) (Oral)   Resp 10   Ht 5\' 6"  (1.676 m)   Wt 72.5 kg   SpO2 99%   BMI 25.81 kg/m  , BMI Body mass index is 25.81 kg/m. GENERAL:  Well appearing HEENT: Pupils equal round and reactive, fundi not visualized, oral  mucosa unremarkable NECK:  No jugular venous distention, waveform within normal limits, carotid upstroke brisk and symmetric, no bruits LUNGS:  Clear to auscultation bilaterally HEART:  RRR.  PMI not displaced or sustained,S1 and S2 within normal limits, no S3, no S4, no clicks, no rubs, no murmurs ABD:  Flat, positive bowel sounds normal in frequency in pitch, no bruits, no rebound, no guarding, no midline pulsatile mass, no hepatomegaly, no splenomegaly EXT:  2 plus pulses throughout, no edema, no cyanosis no clubbing SKIN:  No rashes no nodules NEURO:  Cranial nerves II through XII grossly intact, motor grossly intact throughout PSYCH:  Cognitively intact, oriented to person place and time   Labs    High Sensitivity Troponin:   Recent Labs  Lab 09/21/20 1041 09/21/20 1241  TROPONINIHS 78* 67*      Chemistry Recent Labs  Lab 09/21/20 1041 09/22/20 0309 09/23/20 0245 09/24/20 0256 09/26/20 0239 09/27/20 0347 09/28/20 0446  NA 140 140 141   < > 136 136 135  K 3.4* 3.1* 3.4*   < > 4.3 4.9 4.5  CL 105 105 102   < > 104 104 102  CO2 25 26 29    < > 26 20* 26  GLUCOSE 109* 150* 119*   < > 166* 127* 114*  BUN 19 18 21    < > 27* 25* 21  CREATININE 1.09 1.07 1.26*   < > 1.28* 1.32* 1.43*  CALCIUM 9.1 8.9 9.4   < > 9.1 9.3 9.3  PROT 6.4* 6.0* 5.7*  --   --   --   --   ALBUMIN 3.3* 3.1* 3.0*  --   --   --   --   AST 78* 67* 43*  --   --   --   --   ALT 115* 106* 77*  --   --   --   --   ALKPHOS 112 112 97  --   --   --   --   BILITOT 1.6* 1.7* 1.2  --   --   --   --   GFRNONAA >60 >60 >60   < > >60 >60 55*  ANIONGAP 10 9 10    < > 6 12 7    < > = values in this interval not displayed.     Hematology Recent Labs  Lab 09/21/20 1041 09/24/20 1958 09/25/20 0335 09/25/20 0805 09/25/20 0809 09/25/20 0810  WBC 10.7* 8.9 10.6*  --   --   --   RBC 5.41 5.17 5.11  --   --   --   HGB 15.6 14.8 14.8 15.6 16.0 16.0  HCT 50.4 47.3 47.0 46.0 47.0 47.0  MCV 93.2 91.5 92.0  --   --    --   MCH 28.8 28.6 29.0  --   --   --   MCHC 31.0 31.3 31.5  --   --   --   RDW 15.0 15.1 15.1  --   --   --   PLT 348 342 332  --   --   --     BNP Recent Labs  Lab 09/21/20 1041  BNP 1,739.7*     DDimer No results for input(s): DDIMER in the last 168 hours.   Radiology    No results found.  Cardiac Studies   TTE 09/22/2020  1. Left ventricular ejection fraction, by estimation, is 20 to 25%. The  left ventricle has severely decreased function. The left ventricle  demonstrates global hypokinesis. The left ventricular internal cavity size  was mildly dilated. There is mild left  ventricular hypertrophy. Left ventricular diastolic parameters are  consistent with Grade III diastolic dysfunction (restrictive). Elevated  left atrial pressure.   2. Right ventricular systolic function is mildly reduced. The right  ventricular size is normal. There is moderately elevated pulmonary artery  systolic pressure.   3. Left atrial size was moderately dilated.   4. Right atrial size was moderately dilated.   5. The mitral valve is normal in structure. Mild to moderate mitral valve  regurgitation. No evidence of mitral stenosis.   6. Tricuspid valve regurgitation is moderate.   7. The aortic valve is tricuspid. Aortic valve regurgitation is trivial.  No aortic stenosis is present.   8. The inferior vena cava is dilated in size with <50% respiratory  variability, suggesting right atrial pressure of 15 mmHg.   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  Dominant RCA anatomy. Nonischemic cardiomyopathy with decompensated left heart failure. Moderate pulmonary hypertension with mean pressure 36 mmHg.  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 LVEDP 26 mmHg. Cardiac output based upon a PA saturation of 65% is 3.7 L/min and cardiac index 2.0 L/min/m.  Patient Profile     65 y.o. male with hypertension, tobacco abuse, alcohol  abuse, and GERD admitted with new onset systolic and diastolic heart failure.  Assessment & Plan   #Acute systolic and also heart failure: LVEF this admission 20 to 25%.  He had nonobstructive CAD at cath.  It is likely that alcohol has contributed to his cardiomyopathy.  Blood pressure is stable and he appears euvolemic on exam.  Is negative over 23 L since admission and was net -1.8 L yesterday.  He was transitioned to oral Lasix yesterday.  Renal function is slightly worse today.  Recommend that he have a basic metabolic panel checked in a week.  His Lasix may need to be reduced to 20 mg a day.  Continue carvedilol, digoxin, Entresto, and spironolactone.  He will need a digoxin level checked in a week.  Patient will need a repeat echo in 3 months to assess for ICD candidacy.  Encouraged EtOH cessation.   #Nonobstructive CAD:  He has diffuse, non-obstructive CAD.  Continue aspirin, atorvastatin, and carvedilol.  Check fasting lipids and a CMP in 3 months.  LDL goal is less than 70.  Encourage tobacco cessation.  # Gait instability:  Patient reports feeling unsteady on his feet.  Will ask PT to evaluate for safety and recommendations.   [facility] will sign off.   Medication Recommendations:  none Other recommendations (labs, testing, etc):  Digoxin level and BMP in 1 week.  Echo in 3 months.   Follow up as an outpatient:  we will arrange  For questions or updates, please contact [facility] Please consult [email or web address] for contact info under        Signed, Chilton Si, MD  09/28/2020, 9:57 AM

## 2020-09-28 NOTE — Progress Notes (Signed)
Heart Failure Stewardship Pharmacist Progress Note   PCP: Pcp, No PCP-Cardiologist: Parke Poisson, MD    HPI:  65 yo M w/ PMH of untreated HTN, tobacco use, HLD, and GERD who presented 6/20 with acute SOB and weakness x2-3 weeks. Wakes up in middle of night with dyspnea and palpitations, noted significant LEE. Hypertensive urgency upon arrival BP 220/145, resolved with IV hydralazine. ECHO revealed EF 20-25%. RHC/LHC revealed nonobstructive CAD, moderate pulmonary HTN, and LVEDP 26, PAP 36, PCWP 21.   Current HF Medications: - Carvedilol 6.25mg  BID - Furosemide 40mg  PO daily - Spironolactone 12.5mg  daily - Entresto 24/26mg  BID - Jardiance 10mg  daily - Digoxin 0.125mg  daily  Prior to admission HF Medications: - None  Pertinent Lab Values: Serum creatinine 1.43, BUN 21, Potassium 4.5, Sodium 135, BNP 1739, Magnesium 2.4 A1c 6.7  Vital Signs: Weight: 159 lbs (admission weight: 180 lbs) Blood pressure: 120s/80s Heart rate: 70s   Medication Assistance / Insurance Benefits Check: Does the patient have prescription insurance?  No  Does the patient qualify for medication assistance through manufacturers or grants?   Yes Eligible grants and/or patient assistance programs: Medication assistance applications in progress: Medication assistance applications approved: N/A Approved medication assistance renewals will be completed by: Dr. Valla Leaver  Outpatient Pharmacy:  Prior to admission outpatient pharmacy: [facility] Is the patient willing to use [facility] pharmacy at discharge? Yes Is the patient willing to transition their outpatient pharmacy to utilize a [facility] outpatient pharmacy?   Pending    Assessment/Plan 1) Acute on chronic systolic CHF ([facility]), likely due to NICM (uncontrolled HTN). NYHA class III symptoms. - Good diuresis since admission, net -21.4L out. Appears euvolemic, transitioned to Lasix 40mg  PO daily -  Continue carvedilol 6.25 mg BID - Continue spironolactone 12.5 mg daily - Continue Entresto 24/26mg  BID - Continue Jardiance 10mg  daily - Continue digoxin 0.125mg  daily - Consider titrations at HF TOC f/u visit 7/5  2) Patient assistance: - Patient assistance applications will be submitted for Entresto/Jardiance  3)  Education  - Patient has been educated on current HF medications and potential additions to HF medication regimen - Patient verbalizes understanding that over the next few months, these medication doses may change and more medications may be added to optimize HF regimen - Patient has been educated on basic disease state pathophysiology and goals of therapy - Time spent (30 min) - HF TOC appointment 7/5  ) Makinzi Prieur, PharmD Student  Please check [email or web address] for unit-specific pharmacist phone numbers

## 2020-09-28 NOTE — Plan of Care (Signed)

## 2020-09-28 NOTE — Discharge Summary (Signed)
Name: Juan Kent MRN: [id number] DOB: 04/27/1955 65 y.o. PCP: Pcp, No  Date of Admission: 09/21/2020 10:26 AM Date of Discharge: 09/28/20 Attending Physician: Dr. Oswaldo DoneVincent  Discharge Diagnosis: Principal Problem:   Acute on chronic HFrEF (heart failure with reduced ejection fraction) (HCC) Active Problems:   Tobacco use disorder   Essential hypertension   Chronic liver disease   Alcohol use disorder   Hypervolemia   Acute pulmonary edema (HCC)   Nonischemic cardiomyopathy (HCC)   Nonobstructive atherosclerosis of coronary artery   Pulmonary hypertension (HCC)    Discharge Medications: Allergies as of 09/28/2020   No Known Allergies      Medication List     TAKE these medications    Aspirin Low Dose 81 MG chewable tablet Generic drug: aspirin Chew 1 tablet (81 mg total) by mouth daily. Start taking on: September 29, 2020   atorvastatin 40 MG tablet Commonly known as: LIPITOR Take 1 tablet (40 mg total) by mouth daily. Start taking on: September 29, 2020   buPROPion 150 MG 12 hr tablet Commonly known as: Wellbutrin SR Take 1 tablet (150 mg total) by mouth daily.   carvedilol 6.25 MG tablet Commonly known as: COREG Take 1 tablet (6.25 mg total) by mouth 2 (two) times daily with a meal.   digoxin 0.125 MG tablet Commonly known as: LANOXIN Take 1 tablet (0.125 mg total) by mouth daily. Start taking on: September 29, 2020   Entresto 24-26 MG Generic drug: sacubitril-valsartan Take 1 tablet by mouth 2 (two) times daily.   furosemide 40 MG tablet Commonly known as: LASIX Take 1 tablet (40 mg total) by mouth daily. Start taking on: September 29, 2020   Jardiance 10 MG Tabs tablet Generic drug: empagliflozin Take 1 tablet (10 mg total) by mouth daily. Start taking on: September 29, 2020   spironolactone 25 MG tablet Commonly known as: ALDACTONE Take 0.5 tablets (12.5 mg total) by mouth daily. Start taking on: September 29, 2020       Disposition and follow-up:   Juan Kent  [facility].  At the hospital follow up visit please address:  1.  Follow-up:  a. HFrEF/CAD - Symptons/Medication adherence./Adverse reactions from new medications (itching)   b. Pulmonary HTN - Sleep Study needed   c. New Dx T2DM - medication adherence/possibly meet with Lupita LeashDonna, [facility]   d. Tobacco Use/EtOH Use - F/U cravings etc. Wellbutrin SE  2.  Labs / imaging needed at time of follow-up: BMP  3.  Pending labs/ test needing follow-up: None  4.  Medication Changes Started: Entresto, Jardiance, Carvedilol, Lasix, Spironolactone, Digoxin, Aspirin, Lipitor, Wellbutrin Stopped: None Changed: None Abx - None  End Date:  Follow-up Appointments:  Follow-up Information     [facility] Follow up on 11/19/2020.   Specialty: Internal Medicine Why: 9:20 for hospital follow up for new patient Contact information: [address] [id number] m[place] [id number] [phone]        [facility] Follow up on 10/06/2020.   Specialty: Cardiology Why: at 1200 noon,  call that office for parking instructions the morining of appt. Contact information: [address] [id number] mc [address] [id number] [phone]        Alver SorrowWalker, Caitlin S, NP Follow up on 09/30/2020.   Specialty: Cardiology Why: at 9 AM  BUT please Note different Address  Please go to [address]  [place] [address] Contact information: [address]  [place] [address] [place] [address] [phone]         Belva Agee, MD Follow up.   Specialty: Internal Medicine Why: Our staff will set up an appointment for you and give you a call Contact information: [address] [place] [address] [phone]                Hospital Course by problem list:  Acute on Chronic HFrEF exacerbation Non-Ischemic  Cardiomyopathy Non-obstructive CAD Patient presented with volume overload and dyspnea on exertion.  He was found to have acute on chronic heart failure with reduced ejection fraction.  Echocardiogram performed revealed moderate to severely reduced left ventricular ejection fraction.  He was started on supplemental oxygen, 2 L.  He responded well to IV Lasix and his dyspnea improved.  He had a right as well as left heart catheterization performed which revealed nonobstructive CAD.  The etiology of his HFrEF was thought to be secondary to his uncontrolled hypertension.  He was started on goal-directed therapy of Entresto, Jardiance, carvedilol, Lasix, spironolactone, digoxin.  Sherryll Burger and Jardiance ordered via [facility] and patient filled out application for patient assistance.  Do have some concern that he is unable to afford these medications once patient assistance has expired.  If so he may need to be restarted on losartan which he responded well to while hospitalized.  His discharge dry weight was 72.5 kg.  Patient was discharged home with follow-up with cardiology as well as internal medicine clinic.  On day of discharge patient was started on Entresto and he also endorsed itching sensation throughout his entire body.  This will need to be followed up on to make certain that this medication is not causing him to have an adverse reaction.  Pulmonary Hypertension Patient found to have pulmonary hypertension on heart catheterization.  This is thought to be secondary to untreated OSA.  Patient notes that he wakes up in the middle night gasping for breath and does not get sound sleep.  Patient will need outpatient sleep study  Elevated Liver Enzymes Liver Changes on Imaging Patient did have some evidence of chronic liver disease on imaging.  This is thought to be secondary to patient's alcohol use.  There were no signs of portal hypertension or decrease in static liver function during his admission.  We discussed  alcohol cessation during his hospitalization.  Patient does not feel as though he needs additional support at this time.  We will follow-up on outpatient basis.  Tobacco Use Disorder Patient with longstanding history of tobacco use disorder.  During his hospitalization he denied needing nicotine patches.  We discussed starting many medications that would assist him in quitting tobacco, patient was started on Wellbutrin at discharge.  Because of history of CAD will start about 150 mg daily and see how he tolerates that dose.  Assess blood pressure at follow-up visit on this medication.  Discharge Subjective: Mr. Sondra Come states that he feels well.  He is ambulating around the room without feeling dyspneic, but does have some weakness.  Patient notes that he had a reaction yesterday to his some medication that was given.  He notes that he felt itchy all over it eventually wore off.  He notes last time he had the sensation he had an allergic reaction to Benadryl.  Patient states he feels strong enough to go home.  We discussed following closely with our clinic and that our clinic would reach  out with an appointment and to call us anytime if he ran out of any of his medications or had any other additional questions..  Also discussed following up with cardiology.  Discharge Exam:   BP (!) 127/97 (BP Location: Right Arm)   Pulse 72   Temp 97.8 F (36.6 C) (Oral)   Resp 10   Ht 5\' 6"  (1.676 m)   Wt 72.5 kg   SpO2 92%   BMI 25.81 kg/m  Constitutional: well-appearing, resting in bed comfortably, in no acute distress HENT: normocephalic atraumatic Eyes: conjunctiva non-erythematous Neck: supple Pulmonary/Chest: normal work of breathing on room air Abdominal: soft, non-tender, non-distended MSK: normal bulk and tone Neurological: alert & oriented x 3 Skin: warm and dry Psych: Normal mood and thought process  Pertinent Labs, Studies, and Procedures:  CBC Latest Ref Rng & Units 09/25/2020 09/25/2020  09/25/2020  WBC 4.0 - 10.5 K/uL - - -  Hemoglobin 13.0 - 17.0 g/dL 09/27/2020 38.7 56.4  Hematocrit 39.0 - 52.0 % 47.0 47.0 46.0  Platelets 150 - 400 K/uL - - -    CMP Latest Ref Rng & Units 09/28/2020 09/27/2020 09/26/2020  Glucose 70 - 99 mg/dL 09/28/2020) 951(O) 841(Y)  BUN 8 - 23 mg/dL 21 606(T) 01(S)  Creatinine 0.61 - 1.24 mg/dL 01(U) 9.32(T) 5.57(D)  Sodium 135 - 145 mmol/L 135 136 136  Potassium 3.5 - 5.1 mmol/L 4.5 4.9 4.3  Chloride 98 - 111 mmol/L 102 104 104  CO2 22 - 32 mmol/L 26 20(L) 26  Calcium 8.9 - 10.3 mg/dL 9.3 9.3 9.1  Total Protein 6.5 - 8.1 g/dL - - -  Total Bilirubin 0.3 - 1.2 mg/dL - - -  Alkaline Phos 38 - 126 U/L - - -  AST 15 - 41 U/L - - -  ALT 0 - 44 U/L - - -    DG Chest 2 View  Result Date: 09/21/2020 CLINICAL DATA:  Shortness of breath for 2 weeks. EXAM: CHEST - 2 VIEW COMPARISON:  None. FINDINGS: Cardiomegaly and mild pulmonary vascular congestion noted. Focal opacity overlying the mid LEFT lung may represent atelectasis or airspace disease. A trace LEFT pleural effusion is noted. No pneumothorax or acute bony abnormality identified. IMPRESSION: 1. Focal opacity overlying the mid LEFT lung - question atelectasis versus airspace disease/pneumonia. Radiographic follow-up to resolution is recommended. 2. Trace LEFT pleural effusion. 3. Cardiomegaly and mild pulmonary vascular congestion. Electronically Signed   By: 09/23/2020 M.D.   On: 09/21/2020 11:17   ECHOCARDIOGRAM COMPLETE  Result Date: 09/22/2020    ECHOCARDIOGRAM REPORT   Patient Name:   JONPAUL LUMM Date of Exam: 09/22/2020 Medical Rec #:  09/24/2020  Height:       66.0 in Accession #:    [id number] Weight:       178.0 lb Date of Birth:  Feb 12, 1956  BSA:          1.903 m Patient Age:    65 years   BP:           145/114 mmHg Patient Gender: M          HR:           90 bpm. Exam Location:  Inpatient Procedure: 2D Echo, 3D Echo, Cardiac Doppler, Color Doppler and Intracardiac            Opacification Agent STAT ECHO  Indications:    I50.40* Unspecified combined systolic (congestive) and diastolic                 (  congestive) heart failure  History:        Patient has no prior history of Echocardiogram examinations.                 Risk Factors:Hypertension.  Sonographer:    Sheralyn Boatman RDCS Referring Phys: [id number] JOSHUA ZAVITZ IMPRESSIONS  1. Left ventricular ejection fraction, by estimation, is 20 to 25%. The left ventricle has severely decreased function. The left ventricle demonstrates global hypokinesis. The left ventricular internal cavity size was mildly dilated. There is mild left ventricular hypertrophy. Left ventricular diastolic parameters are consistent with Grade III diastolic dysfunction (restrictive). Elevated left atrial pressure.  2. Right ventricular systolic function is mildly reduced. The right ventricular size is normal. There is moderately elevated pulmonary artery systolic pressure.  3. Left atrial size was moderately dilated.  4. Right atrial size was moderately dilated.  5. The mitral valve is normal in structure. Mild to moderate mitral valve regurgitation. No evidence of mitral stenosis.  6. Tricuspid valve regurgitation is moderate.  7. The aortic valve is tricuspid. Aortic valve regurgitation is trivial. No aortic stenosis is present.  8. The inferior vena cava is dilated in size with <50% respiratory variability, suggesting right atrial pressure of 15 mmHg. FINDINGS  Left Ventricle: Left ventricular ejection fraction, by estimation, is 20 to 25%. The left ventricle has severely decreased function. The left ventricle demonstrates global hypokinesis. Definity contrast agent was given IV to delineate the left ventricular endocardial borders. The left ventricular internal cavity size was mildly dilated. There is mild left ventricular hypertrophy. Left ventricular diastolic parameters are consistent with Grade III diastolic dysfunction (restrictive). Elevated left atrial pressure. Right Ventricle: The  right ventricular size is normal. Right ventricular systolic function is mildly reduced. There is moderately elevated pulmonary artery systolic pressure. The tricuspid regurgitant velocity is 2.84 m/s, and with an assumed right atrial pressure of 15 mmHg, the estimated right ventricular systolic pressure is 47.3 mmHg. Left Atrium: Left atrial size was moderately dilated. Right Atrium: Right atrial size was moderately dilated. Pericardium: There is no evidence of pericardial effusion. Mitral Valve: The mitral valve is normal in structure. Mild mitral annular calcification. Mild to moderate mitral valve regurgitation. No evidence of mitral valve stenosis. Tricuspid Valve: The tricuspid valve is normal in structure. Tricuspid valve regurgitation is moderate . No evidence of tricuspid stenosis. Aortic Valve: The aortic valve is tricuspid. Aortic valve regurgitation is trivial. No aortic stenosis is present. Pulmonic Valve: The pulmonic valve was normal in structure. Pulmonic valve regurgitation is not visualized. No evidence of pulmonic stenosis. Aorta: The aortic root is normal in size and structure. Venous: The inferior vena cava is dilated in size with less than 50% respiratory variability, suggesting right atrial pressure of 15 mmHg. IAS/Shunts: No atrial level shunt detected by color flow Doppler.  LEFT VENTRICLE PLAX 2D LVIDd:         5.50 cm      Diastology LVIDs:         5.00 cm      LV e' medial:    4.16 cm/s LV PW:         1.30 cm      LV E/e' medial:  21.2 LV IVS:        1.20 cm      LV e' lateral:   6.02 cm/s LVOT diam:     1.80 cm      LV E/e' lateral: 14.7 LV SV:         24  LV SV Index:   13 LVOT Area:     2.54 cm  LV Volumes (MOD) LV vol d, MOD A2C: 159.0 ml LV vol d, MOD A4C: 106.0 ml LV vol s, MOD A2C: 131.0 ml LV vol s, MOD A4C: 98.5 ml LV SV MOD A2C:     28.0 ml LV SV MOD A4C:     106.0 ml LV SV MOD BP:      14.4 ml RIGHT VENTRICLE            IVC RV S prime:     6.75 cm/s  IVC diam: 2.40 cm TAPSE  (M-mode): 0.8 cm LEFT ATRIUM             Index       RIGHT ATRIUM           Index LA diam:        4.60 cm 2.42 cm/m  RA Area:     22.90 cm LA Vol (A2C):   48.3 ml 25.38 ml/m RA Volume:   74.80 ml  39.30 ml/m LA Vol (A4C):   50.0 ml 26.27 ml/m LA Biplane Vol: 51.6 ml 27.11 ml/m  AORTIC VALVE LVOT Vmax:   76.30 cm/s LVOT Vmean:  43.700 cm/s LVOT VTI:    0.094 m  AORTA Ao Root diam: 3.60 cm Ao Asc diam:  3.70 cm MITRAL VALVE               TRICUSPID VALVE MV Area (PHT): 4.66 cm    TR Peak grad:   32.3 mmHg MV Decel Time: 163 msec    TR Vmax:        284.00 cm/s MV E velocity: 88.38 cm/s                            SHUNTS                            Systemic VTI:  0.09 m                            Systemic Diam: 1.80 cm Olga Millers MD Electronically signed by Olga Millers MD Signature Date/Time: 09/22/2020/12:06:18 PM    Final    US Abdomen Limited RUQ (LIVER/GB)  Result Date: 09/21/2020 CLINICAL DATA:  Elevated LFT EXAM: ULTRASOUND ABDOMEN LIMITED RIGHT UPPER QUADRANT COMPARISON:  None. FINDINGS: Gallbladder: No shadowing stones. Slight increased wall thickness at 4.3 mm. Negative sonographic Murphy. Common bile duct: Diameter: 3.6 mm Liver: Nodular hepatic contour consistent with cirrhosis. No focal hepatic abnormality. Portal vein is patent on color Doppler imaging with normal direction of blood flow towards the liver. Other: Right pleural effusion IMPRESSION: 1. Sonographic findings consistent with hepatic cirrhosis. 2. Slightly thickened gallbladder wall, nonspecific. No shadowing stones 3. Right pleural effusion Electronically Signed   By: Jasmine Pang M.D.   On: 09/21/2020 18:49     Discharge Instructions: Discharge Instructions     Call MD for:   Complete by: As directed    Call MD for:  difficulty breathing, headache or visual disturbances   Complete by: As directed    Call MD for:  extreme fatigue   Complete by: As directed    Call MD for:  hives   Complete by: As directed    Call MD  for:  persistant dizziness or light-headedness   Complete by: As  directed    Call MD for:  persistant nausea and vomiting   Complete by: As directed    Call MD for:  redness, tenderness, or signs of infection (pain, swelling, redness, odor or green/yellow discharge around incision site)   Complete by: As directed    Call MD for:  severe uncontrolled pain   Complete by: As directed    Call MD for:  temperature >100.4   Complete by: As directed    Diet - low sodium heart healthy   Complete by: As directed    Increase activity slowly   Complete by: As directed        Signed: Belva Agee, MD 09/28/2020, 4:25 PM   Pager: [phone]

## 2020-09-28 NOTE — Discharge Instructions (Addendum)
[name] thank you for allowing me to care for you during your hospitalization.  You were admitted for shortness of breath as well as leg swelling.  You are found to have a heart failure exacerbation.  He also had a cardiac catheterization performed which looked for blockages in the artery in your heart.  No full blockages were seen.  We have started him on many new medications for your heart failure.  This will be listed below.  I have also started you on Wellbutrin to help you stop smoking cigarettes.  Cardiology also request that you take your weights daily and record these in a book and bring these into your appointments.  They also recommend a low salt diet.  Please use sodium replacement seasoning such as Mrs. Dash.  Some salt replacement seasonings have potassium rather than sodium, please avoid these.  I have reached out to our clinic to schedule you a follow-up appointment.  Please also follow-up with the cardiologist.  Please continue to work with [place] and reach out if you have any questions in terms of your medications.  New medications: Aspirin, Lipitor, Coreg, digoxin, Jardiance, Lasix, Entresto, Aldactone, Wellbutrin   Thank you and we will see you in the clinic Dr. Evie Lacks

## 2020-09-28 NOTE — Progress Notes (Signed)
D/C instructions given and reviewed. Tele and IV removed, tolerated well. Awaiting TOC med delivery. 

## 2020-09-28 NOTE — TOC Transition Note (Signed)
Transition of Care [facility]) - CM/SW Discharge Note   Patient Details  Name: Juan Kent MRN: [id number] Date of Birth: May 21, 1955  Transition of [facility]) CM/SW Contact:  Leone Haven, RN Phone Number: 09/28/2020, 2:45 PM   Clinical Narrative:    Patient is for dc today, NCM assisted him with Match letter, [facility], will need patient ast from pharmacy at [facility].  He will need to go to [facility] clinic for refill fo 10.00.   Final next level of care: Home/Self Care Barriers to Discharge: No Barriers Identified   Patient Goals and CMS Choice Patient states their goals for this hospitalization and ongoing recovery are:: return home      Discharge Placement                       Discharge Plan and Services                  DME Agency: NA       HH Arranged: NA          Social Determinants of Health (SDOH) Interventions Food Insecurity Interventions: Assist with SNAP Application Financial Strain Interventions: Artist, Other (Comment) (will need referral to servant center for food stamp, disability, and email sent to [employer] for medicaid screening) Housing Interventions: Intervention Not Indicated, Other (Comment) (patient reports living with his mother in a mobile home) Transportation Interventions: Intervention Not Indicated   Readmission Risk Interventions No flowsheet data found.

## 2020-09-28 NOTE — Progress Notes (Signed)
Heart Failure Patient Advocate Encounter  Completed application for [employer] Patient Assistance Program sent in an effort to reduce the patient's out of pocket expense for Entresto to $0.    Application completed and faxed to [phone].  [employer] patient assistance phone number for follow up is [phone].    Completed application for [employer] Patient Assistance Program sent in an effort to reduce the patient's out of pocket expense for Jardiance to $0.    Application completed and faxed to [phone] [facility] Cares patient assistance phone number for follow up is [phone].   Sharen Hones, PharmD, BCPS Heart Failure Stewardship Pharmacist Phone [phone]  Please check [email or web address] for unit-specific pharmacist phone numbers

## 2020-09-28 NOTE — Evaluation (Signed)
Physical Therapy Evaluation Patient Details Name: Juan Kent MRN: [id number] DOB: [birth year] Today's Date: 09/28/2020   History of Present Illness  Juan Kent is a 65 y/o male admitted 09/21/20 with c/o DOE, palpitations, increasing leg edema/cramping. Workup for acute heart failure exacerbation and severe HTN. PMH: HTN, HLD, not currently engaged in medical therapy, tobacco use disorder and acid reflux. PT signed off on 6/22, and re-ordered on 6/27 per pt request of feeling more unsteady.  Clinical Impression  Pt seen for re-eval per MD and pt request. Pt reporting increased unsteadiness with mobility tasks. When evaluated, pt reports his legs felt "rubbery", but overall tolerated mobility well. Required min guard to supervision for safety only; no physical assist required. Educated about mobility pacing at home. Anticipate he will progress well and will not require follow up PT. Will continue to follow acutely.     Follow Up Recommendations No PT follow up    Equipment Recommendations  None recommended by PT    Recommendations for Other Services       Precautions / Restrictions Precautions Precautions: None Restrictions Weight Bearing Restrictions: No      Mobility  Bed Mobility Overal bed mobility: Independent             General bed mobility comments: Reports mild dizziness, but BP WFL at 100/mid 70s.    Transfers Overall transfer level: Independent                  Ambulation/Gait Ambulation/Gait assistance: Supervision;Min guard Gait Distance (Feet): 125 Feet Assistive device: None Gait Pattern/deviations: Step-through pattern;Decreased stride length Gait velocity: Decreased   General Gait Details: Pt reports legs felt "rubbery", but no LOB noted. Pt also reports mild lightheadedness, but did not seem to affect mobility. Overall at a supervision to min guard level for safety. No physical assist required.  Stairs            Wheelchair Mobility     Modified Rankin (Stroke Patients Only)       Balance Overall balance assessment: Mild deficits observed, not formally tested                                           Pertinent Vitals/Pain Pain Assessment: No/denies pain    Home Living Family/patient expects to be discharged to:: Private residence Living Arrangements: Parent Available Help at Discharge: Family;Available PRN/intermittently Type of Home: Mobile home Home Access: Stairs to enter Entrance Stairs-Rails: Right;Left Entrance Stairs-Number of Steps: 5 Home Layout: One level Home Equipment: None      Prior Function Level of Independence: Independent         Comments: 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 Indepedent with ADLs/IADLs     Hand Dominance        Extremity/Trunk Assessment   Upper Extremity Assessment Upper Extremity Assessment: Defer to OT evaluation    Lower Extremity Assessment Lower Extremity Assessment: Generalized weakness (reports legs felt "rubbery")    Cervical / Trunk Assessment Cervical / Trunk Assessment: Normal  Communication   Communication: No difficulties  Cognition Arousal/Alertness: Awake/alert Behavior During Therapy: WFL for tasks assessed/performed Overall Cognitive Status: Within Functional Limits for tasks assessed  General Comments General comments (skin integrity, edema, etc.): Discussed activity pacing with mobility tasks at home.    Exercises     Assessment/Plan    PT Assessment Patient needs continued PT services  PT Problem List Decreased activity tolerance;Decreased mobility;Cardiopulmonary status limiting activity;Decreased balance;Decreased strength       PT Treatment Interventions Gait training;Stair training;Functional mobility training;Therapeutic activities;Therapeutic exercise;Patient/family education    PT Goals (Current goals can be  found in the Care Plan section)  Acute Rehab PT Goals Patient Stated Goal: return to work PT Goal Formulation: With patient Time For Goal Achievement: 10/12/20 Potential to Achieve Goals: Good    Frequency Min 3X/week   Barriers to discharge        Co-evaluation               AM-PAC PT "6 Clicks" Mobility  Outcome Measure Help needed turning from your back to your side while in a flat bed without using bedrails?: None Help needed moving from lying on your back to sitting on the side of a flat bed without using bedrails?: None Help needed moving to and from a bed to a chair (including a wheelchair)?: None Help needed standing up from a chair using your arms (e.g., wheelchair or bedside chair)?: None Help needed to walk in hospital room?: A Little Help needed climbing 3-5 steps with a railing? : A Little 6 Click Score: 22    End of Session Equipment Utilized During Treatment: Gait belt Activity Tolerance: Patient tolerated treatment well Patient left: in chair;with call bell/phone within reach Nurse Communication: Mobility status PT Visit Diagnosis: Other abnormalities of gait and mobility (R26.89)    Time: 1100-1120 PT Time Calculation (min) (ACUTE ONLY): 20 min   Charges:   PT Evaluation $PT Eval Low Complexity: 1 Low          Juan Kent, DPT  Acute Rehabilitation Services  Pager: [phone] Office: [phone]   Juan Kent 09/28/2020, 11:32 AM

## 2020-09-29 ENCOUNTER — Telehealth: Payer: Self-pay | Admitting: Student

## 2020-09-29 NOTE — Telephone Encounter (Signed)
TOC NEW HFU APPT       Date: 10/09/2020 Status: Sch  Time: 10:15 AM Length: 60  Visit Type: OPEN ESTABLISHED [726] Copay: $0.00  Provider: Belva Agee, MD Department: [facility]  Referring Provider:

## 2020-09-30 ENCOUNTER — Telehealth (HOSPITAL_COMMUNITY): Payer: Self-pay

## 2020-09-30 ENCOUNTER — Encounter (HOSPITAL_COMMUNITY): Payer: Self-pay

## 2020-09-30 NOTE — Telephone Encounter (Signed)
Patient has been approved to receive Entresto from [employer] patient assistance program from 09/29/20-09/29/21. The [employer] pharmacy will contact the patient to set up initial delivery of the [place].  Unfortunately, he was not approved to receive Jardiance from [facility] patient assistance program because his income was too high. Will discuss with CSW about alternative options.  Contacted patient to inform him about expecting call from [employer] and with update regarding Jardiance. Unable to reach pt but have left voicemail with callback number for questions.   His appt with HF TOC clinic is on 7/5

## 2020-10-02 NOTE — Progress Notes (Signed)
[facility] Transitions [facility]  PCP: None Primary Cardiologist: Weston Brass  HPI:  Juan Kent is a 65 y.o.  male  with a PMH significant for HTN, tobacco use, HLD, GERD, Recently diagnosed Cirrhosis, Systolic and Diastolic CHF  No prior cardiac history, admitted 09/21/2020 for acute on likely chronic combined CHF. BP on arrival 220/145 ST at 107.  BNP 1739, Hs troponin 78, 67 and ekg sinus rhythm without acute ischemia.  His AST/ALT was elevated but below 200 with bili 1.6.   Seems he had some degree of symptoms for around 2 years prior to admission and his edema/ dyspnea worsened dramatically about two weeks prior to admission. He endorsed long standing heavy alcohol use and had an abd [place] consistent with hepatic cirrhosis without ascites, and ECHO with EF 20-25%, G3DD, mildly reduced RV function, Moderate MR and TR.  He was started on IV lasix.  Given risk factors and presentation he underwent L/RHC which demonstrated   Diffuse nonobstructive coronary atherosclerosis involving LAD, circumflex, and RCA.  Dominant RCA anatomy. Nonischemic cardiomyopathy with decompensated left heart failure. Moderate pulmonary hypertension with mean pressure 36 mmHg.  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 LVEDP 26 mmHg. Cardiac output based upon a PA saturation of 65% is 3.7 L/min and cardiac index 2.0 L/min/m.  Wt trend 178>160lbs.  His GDMT was titrated during admission and he was discharged on jardiance 10, entresto 24/26, aldactone 12.5, carvedilol 6.25mg  BID, digoxin 0.118mcg, lasix 40mg  daily   Feeling well overall since discharge.  Occasionally having some dizziness episodes.  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  Checking his bp at home runs about 140's-150's systolic and 100 diastolic on average.  Still smoking but a lot less about a pack per week.  Has cut back drinking significantly.  Breathing remains much  improved.  Sleeping has been better since no longer having orthopnea or PND.    ROS: All systems negative except as listed in HPI, PMH and Problem List.  SH:  Social History   Socioeconomic History   Marital status: Widowed    Spouse name: Not on file   Number of children: Not on file   Years of education: Not on file   Highest education level: Not on file  Occupational History   Not on file  Tobacco Use   Smoking status: Every Day    Pack years: 0.00    Types: Cigarettes   Smokeless tobacco: Never  Substance and Sexual Activity   Alcohol use: Yes   Drug use: Never   Sexual activity: Not on file  Other Topics Concern   Not on file  Social History Narrative   Not on file   Social Determinants of Health   Financial Resource Strain: High Risk   Difficulty of Paying Living Expenses: Hard  Food Insecurity: Food Insecurity Present   Worried About Running Out of Food in the Last Year: Sometimes true   Ran Out of Food in the Last Year: Sometimes true  Transportation Needs: No Transportation Needs   Lack of Transportation (Medical): No   Lack of Transportation (Non-Medical): No  Physical Activity: Not on file  Stress: Not on file  Social Connections: Not on file  Intimate Partner Violence: Not on file    FH:  Family History  Problem Relation Age of Onset   Cancer Father     Past Medical History:  Diagnosis Date   Hypertension  Current Outpatient Medications  Medication Sig Dispense Refill   aspirin 81 MG chewable tablet Chew 1 tablet (81 mg total) by mouth daily. 30 tablet 0   atorvastatin (LIPITOR) 40 MG tablet Take 1 tablet (40 mg total) by mouth daily. 30 tablet 0   buPROPion (WELLBUTRIN SR) 150 MG 12 hr tablet Take 1 tablet (150 mg total) by mouth daily. 30 tablet 0   carvedilol (COREG) 6.25 MG tablet Take 1 tablet (6.25 mg total) by mouth 2 (two) times daily with a meal. 60 tablet 0   digoxin (LANOXIN) 0.125 MG tablet Take 1 tablet (0.125 mg total) by  mouth daily. 30 tablet 0   empagliflozin (JARDIANCE) 10 MG TABS tablet Take 1 tablet (10 mg total) by mouth daily. 30 tablet 0   furosemide (LASIX) 40 MG tablet Take 1 tablet (40 mg total) by mouth daily. 30 tablet 0   sacubitril-valsartan (ENTRESTO) 24-26 MG Take 1 tablet by mouth 2 (two) times daily. 60 tablet 0   spironolactone (ALDACTONE) 25 MG tablet Take 0.5 tablets (12.5 mg total) by mouth daily. 30 tablet 0   No current facility-administered medications for this encounter.    Vitals:   10/06/20 1203  BP: (!) 154/100  Pulse: 79  SpO2: 96%  Weight: 73.4 kg (161 lb 12.8 oz)    PHYSICAL EXAM: Cardiac: JVD flat, normal rate and rhythm, clear s1 and s2, 2/6 MR, no rubs or gallops, no LE edema Pulmonary: distant breath sounds in all fields, not in distress Abdominal: non distended abdomen, soft and nontender Psych: Alert, conversant, in good spirits  ASSESSMENT & PLAN: Chronic Combined Systolic and Diastolic CHF: -recent diagnosis in the setting of heavy alcohol use, long standing poorly controlled HTN. -09/2020 ECHO with EF 20-25%, G3DD, mildly reduced RV function, Moderate MR and TR. -09/2020 LHC with non obstructive diffuse CAD -NYHA Class II, euvolemic on exam REDS 39 but I expect this is overestimated due to MR -Currently taking jardiance 10, entresto 24/26, aldactone 12.5, carvedilol 6.25mg  BID, digoxin 0.161mcg -original plan was to increase entresto to 49/51 and decrease lasix to 20mg  daily.  However [facility] returned with K at 5.2 we will hold spiro and wait 24 hours then increase entresto to 49/51.  He will go ahead and decrease lasix to 20mg  daily.   CAD: -continue asa, statin, GDMT above  Alcohol Use Disorder: -Has cut way back, no longer drinking liquor.  Before admission was drinking liquor until he passed out every night -now only one drink on occasion, had a beer 4th of July -he is on wellbutrin, may benefit from PHQ-9 to assess response  Tobacco Use  Disorder: -still smoking but a lot less about one pack per day, on wellbutrin, he will continue cutting back  HTN: -Remains hypertensive -Increase entresto and carvedilol as above  Chronic Liver Disease: -Cirrhosis on RUQ imaging, no portal HTN and minimal synthetic dysfunction by labs currently -has all but stopped his alcohol use which is vital -will need ongoing surveillance by PCP  Lab visit in one week here, then follow up with [facility] later this month

## 2020-10-06 ENCOUNTER — Other Ambulatory Visit: Payer: Self-pay

## 2020-10-06 ENCOUNTER — Telehealth (HOSPITAL_COMMUNITY): Payer: Self-pay | Admitting: Surgery

## 2020-10-06 ENCOUNTER — Ambulatory Visit (HOSPITAL_COMMUNITY)
Admit: 2020-10-06 | Discharge: 2020-10-06 | Disposition: A | Discharge status: Home / Self Care | Payer: Self-pay | Source: Ambulatory Visit | Attending: Internal Medicine | Admitting: Internal Medicine

## 2020-10-06 ENCOUNTER — Encounter (HOSPITAL_COMMUNITY): Payer: Self-pay

## 2020-10-06 VITALS — BP 154/100 | HR 79 | Wt 161.8 lb

## 2020-10-06 DIAGNOSIS — F1010 Alcohol abuse, uncomplicated: Secondary | ICD-10-CM

## 2020-10-06 DIAGNOSIS — F17210 Nicotine dependence, cigarettes, uncomplicated: Secondary | ICD-10-CM | POA: Insufficient documentation

## 2020-10-06 DIAGNOSIS — I5042 Chronic combined systolic (congestive) and diastolic (congestive) heart failure: Secondary | ICD-10-CM | POA: Insufficient documentation

## 2020-10-06 DIAGNOSIS — K769 Liver disease, unspecified: Secondary | ICD-10-CM

## 2020-10-06 DIAGNOSIS — K7460 Unspecified cirrhosis of liver: Secondary | ICD-10-CM | POA: Insufficient documentation

## 2020-10-06 DIAGNOSIS — F17200 Nicotine dependence, unspecified, uncomplicated: Secondary | ICD-10-CM

## 2020-10-06 DIAGNOSIS — Z79899 Other long term (current) drug therapy: Secondary | ICD-10-CM | POA: Insufficient documentation

## 2020-10-06 DIAGNOSIS — I110 Hypertensive heart disease with heart failure: Secondary | ICD-10-CM | POA: Insufficient documentation

## 2020-10-06 DIAGNOSIS — Z7982 Long term (current) use of aspirin: Secondary | ICD-10-CM | POA: Insufficient documentation

## 2020-10-06 DIAGNOSIS — Z7901 Long term (current) use of anticoagulants: Secondary | ICD-10-CM | POA: Insufficient documentation

## 2020-10-06 DIAGNOSIS — E785 Hyperlipidemia, unspecified: Secondary | ICD-10-CM | POA: Insufficient documentation

## 2020-10-06 DIAGNOSIS — I5022 Chronic systolic (congestive) heart failure: Secondary | ICD-10-CM

## 2020-10-06 DIAGNOSIS — I428 Other cardiomyopathies: Secondary | ICD-10-CM | POA: Insufficient documentation

## 2020-10-06 DIAGNOSIS — I272 Pulmonary hypertension, unspecified: Secondary | ICD-10-CM | POA: Insufficient documentation

## 2020-10-06 DIAGNOSIS — I2510 Atherosclerotic heart disease of native coronary artery without angina pectoris: Secondary | ICD-10-CM | POA: Insufficient documentation

## 2020-10-06 DIAGNOSIS — R42 Dizziness and giddiness: Secondary | ICD-10-CM | POA: Insufficient documentation

## 2020-10-06 LAB — BASIC METABOLIC PANEL
Anion gap: 9 (ref 5–15)
BUN: 28 mg/dL — ABNORMAL HIGH (ref 8–23)
CO2: 25 mmol/L (ref 22–32)
Calcium: 9.9 mg/dL (ref 8.9–10.3)
Chloride: 101 mmol/L (ref 98–111)
Creatinine, Ser: 1.35 mg/dL — ABNORMAL HIGH (ref 0.61–1.24)
GFR, Estimated: 59 mL/min — ABNORMAL LOW (ref >60)
Glucose, Bld: 108 mg/dL — ABNORMAL HIGH (ref 70–99)
Potassium: 5.2 mmol/L — ABNORMAL HIGH (ref 3.5–5.1)
Sodium: 135 mmol/L (ref 135–145)

## 2020-10-06 LAB — BRAIN NATRIURETIC PEPTIDE: B Natriuretic Peptide: 146.4 pg/mL — ABNORMAL HIGH (ref 0.0–100.0)

## 2020-10-06 MED ORDER — ENTRESTO 49-51 MG PO TABS: 1.0000 | ORAL_TABLET | Freq: Two times a day (BID) | ORAL | 3 refills | Status: DC

## 2020-10-06 MED ORDER — CARVEDILOL 12.5 MG PO TABS: 12.5000 mg | ORAL_TABLET | Freq: Two times a day (BID) | ORAL | 3 refills | Status: DC

## 2020-10-06 MED ORDER — FUROSEMIDE 20 MG PO TABS: 20.0000 mg | ORAL_TABLET | Freq: Every day | ORAL | 3 refills | Status: DC

## 2020-10-06 NOTE — Telephone Encounter (Signed)
Patient contacted to remind him of upcoming scheduled appt in the HF heart Impact [facility].  There was no answer however I left a message to indicate date and time of appt.

## 2020-10-06 NOTE — Patient Instructions (Signed)
Great to see you!  Please Increase Entresto to 49/51 mg twice daily Decrease Lasix ( Furosemide) to 20 mg daily Increase Coreg (Carvedilol) to 12.5 mg twice daily. Labwork today --we will only contact you with abnormal values. Labs to be repeated in 1 week.  Please refer to scheduled appt date and time.

## 2020-10-06 NOTE — Progress Notes (Signed)
Heart and Vascular Care Navigation  10/06/2020  Juan Kent [birth year] [id number]  Reason for Referral: Patient seen in [facility]   Engaged with patient face to face for initial visit for Heart and Vascular Care Coordination.                                                                                                   Assessment:   Patient is a single 65 yo male who works full time and states he could not afford the health insurance on the Conservation officer, historic buildings. Patient reports he lives in a single family home with his mother. Patient plans to continue to work as long as possible and hopeful to make it to 32 when he would be eligible for Medicare and his retirement benefits. He is over income for [employer] medication assistance program as well as a Patient Assistance Program.   Patient is unsure if he has Artist assisting him with insurance options.  He denies any concerns with housing or transportation.                          HRT/VAS Care Coordination     Patients Home Cardiology Office --  [facility]   Outpatient Care Team Social Worker   Social Worker Name: Lasandra Beech, [place] [phone]   Living arrangements for the past 2 months Single Family Home   Lives with: Parents   Patient Current Insurance Coverage Self-Pay   Patient Has Concern With Paying Medical Bills Yes   Medical Bill Referrals: Financial Counseling   Does Patient Have Prescription Coverage? No   Home Assistive Devices/Equipment None   DME Agency NA       Social History:                                                                             SDOH Screenings   Alcohol Screen: Not on file  Depression (PHQ2-9): Not on file  Financial Resource Strain: High Risk   Difficulty of Paying Living Expenses: Hard  Food Insecurity: Food Insecurity Present   Worried About Running Out of Food in the Last Year: Sometimes true   Ran Out of Food in the Last Year: Sometimes true  Housing: Low  Risk    Last Housing Risk Score: 0  Physical Activity: Not on file  Social Connections: Not on file  Stress: Not on file  Tobacco Use: High Risk   Smoking Tobacco Use: Every Day   Smokeless Tobacco Use: Never  Transportation Needs: No Transportation Needs   Lack of Transportation (Medical): No   Lack of Transportation (Non-Medical): No    SDOH Interventions: Financial Resources:    Editor, commissioning for [employer] Program  Food Insecurity:   Food bank  resources  Housing Insecurity:   Denies need  Transportation:    Denies need   Follow-up plan:  CSW discussed some options although patient is over income for many assistance programs. Patient will check at home to see if he has applied for CAFA yet and follow up with CSW on next Lab appointment. CSW will provide information on Good Rx card to reduce medication expenses as well as CAFA if needed. Patient appreciative and will follow up with CSW. Lasandra Beech, LCSW, CCSW-MCS [phone]

## 2020-10-06 NOTE — Progress Notes (Addendum)
[facility] Vascular Center Transitions of Care Clinic Heart Failure Pharmacist Encounter  PCP: Pcp, No PCP-Cardiologist: Parke Poisson, MD  HPI:  89 YOM with a PMH of hypertension, tobacco use, HLD, GERD, cirrhosis, and newly diagnosed systolic and diastolic HF. Patient was admitted to ED on 09/21/20 with c/o SOB x 1-2 weeks. Patient does have uncontrolled BP but has not taken medications for years. Patient does state he feels like there was some swelling bilaterally in LE. Patient denies any history of DVT or PE. ECHO on 09/22/20 showed LVEF 20-25% and right ventricular systolic function to be mildly reduced. R/LHC on 09/25/20 showed nonischemic cardiomyopathy involving LAD, circumflex, and RCA.  Today, Chidiebere Wynn presents to the Heart Failure TOC Clinic for follow up. He denies having shortness of breath, DOE, orthopnea/PND, or edema. He says he has some lightheadedness/dizziness upon standing. He has been checking his weight and BP daily at home. He has been following a low sodium and fluid restricted diet. He is taking all medications as prescribed.   HF Medications: Carvedilol 6.25 mg BID Entresto 24/26 mg BID Spironolactone 12.5 mg daily Jardiance 10 mg daily  Digoxin 0.125 mg daily Furosemide 40 mg daily  Has the patient been experiencing any side effects to the medications prescribed?  no  Does the patient have any problems obtaining medications due to transportation or finances?   Yes - no insurance; has been approved for [employer] patient assistance through [employer] but has been denied assistance for Jardiance through [employer] of regimen: good Understanding of indications: good Potential of compliance: good Patient understands to avoid NSAIDs. Patient understands to avoid decongestants.   Pertinent Lab Values: Serum creatinine 1.35, BUN 28, Potassium 5.2, Sodium 135, BNP 146.4, Magnesium 2.5  Vital Signs: Weight: 161 lbs (discharge weight: 160 lbs) Blood  pressure: 154/100 lbs Heart rate: 79 bpm  Medication Assistance / Insurance Benefits Check: Does the patient have prescription insurance?  No Type of insurance plan: none  Does the patient qualify for medication assistance through manufacturers or grants?   Yes Eligible grants and/or patient assistance programs: Entresto Medication assistance applications in progress: none Medication assistance applications approved: Entresto Approved medication assistance renewals will be completed by: pending  Outpatient Pharmacy:  Current outpatient pharmacy: [facility] on [address] Was the [facility] pharmacy used to supply discharge medications? yes  If TOC pharmacy was used, were the refills transferred out to current pharmacy yet? no  Is the patient willing to transition their outpatient pharmacy to utilize a [facility] outpatient pharmacy with or without mail order?   No  Assessment: 1) Chronic systolic CHF (EF 95-09%), due to longstanding uncontrolled hypertension. NYHA class II symptoms. - Increase carvedilol to 12.5 mg BID - Increase Entresto to 49/51 mg daily - Hold spironolactone for a 48 hours d/t hyperkalemia - Continue digoxin 0.125 mg daily. Unable to check level today since it has been only 3 hours since last dose. - Decrease furosemide to 20 mg daily - Continue Jardiance 10 mg daily   Plan: 1) Medication changes: - Increase carvedilol to 12.5 mg BID - Increase Entresto to 49-51 mg daily - Hold spironolactone for 2 days - Decrease lasix to 20 mg daily  2) Patient Assistance: - Patient still uninsured, eligible for Medicare next February. Appreciate assistance from CSW. - Patient approved for [employer] patient assistance for [facility] - Patient does not qualify for patient assistance with Jardiance d/t income. Investigating other assistance opportunities (cannot qualify for [employer]assist either)  3) Follow up: -  Next appointment with HF TOC on 7/12  Asencion Gowda PharmD/MBA  Candidate [facility] Class of 2023  Sharen Hones, PharmD, BCPS Heart Failure Transitions of Care Clinic Pharmacist Phone [phone]

## 2020-10-06 NOTE — Progress Notes (Signed)
ReDS Vest / Clip - 10/06/20 1228       ReDS Vest / Clip   Station Marker C    Ruler Value 27    ReDS Value Range Moderate volume overload    ReDS Actual Value 39

## 2020-10-07 NOTE — Telephone Encounter (Signed)
Transition Care Management Follow-up Telephone Call Date of discharge and from where: 09/28/20 [facility] How have you been since you were released from the hospital? "I've been okay. I've had some episodes of dizziness since I was discharged, but I had an appt with the cardiologist yesterday and mentioned this to him".  My K+ was also up a little and he took me off of the Aldactone.  My b/p has been running about 150's/100's Any questions or concerns? No  Items Reviewed: Did the pt receive and understand the discharge instructions provided? Yes  Medications obtained and verified? Yes  Other? No  Any new allergies since your discharge? Yes  Dietary orders reviewed? Yes Do you have support at home? No   Home Care and Equipment/Supplies: Were home health services ordered? no  Functional Questionnaire: (I = Independent and D = Dependent) ADLs: I  Bathing/Dressing- I  Meal Prep- I  Eating- I  Maintaining continence- I  Transferring/Ambulation- I  Managing Meds- I  Follow up appointments reviewed:  PCP Hospital f/u appt confirmed? Yes  Scheduled to see Dr. Evie Lacks on 10/09/20 @ 1015. Specialist Hospital f/u appt confirmed? Yes  Scheduled to see Cardiologist seen yesterday TRansportation arrangements needed? No  If their condition worsens, is the pt aware to call PCP or go to the Emergency Dept.? Yes Was the patient provided with contact information for the PCP's office or ED? Yes Was to pt encouraged to call back with questions or concerns? Yes

## 2020-10-09 ENCOUNTER — Other Ambulatory Visit: Payer: Self-pay

## 2020-10-09 ENCOUNTER — Encounter: Payer: Self-pay | Admitting: Student

## 2020-10-09 ENCOUNTER — Ambulatory Visit (INDEPENDENT_AMBULATORY_CARE_PROVIDER_SITE_OTHER): Payer: Self-pay | Admitting: Student

## 2020-10-09 VITALS — BP 131/83 | HR 93 | Temp 98.4°F | Ht 65.0 in | Wt 162.3 lb

## 2020-10-09 DIAGNOSIS — I5023 Acute on chronic systolic (congestive) heart failure: Secondary | ICD-10-CM

## 2020-10-09 DIAGNOSIS — F17200 Nicotine dependence, unspecified, uncomplicated: Secondary | ICD-10-CM

## 2020-10-09 DIAGNOSIS — Z0000 Encounter for general adult medical examination without abnormal findings: Secondary | ICD-10-CM

## 2020-10-09 DIAGNOSIS — I272 Pulmonary hypertension, unspecified: Secondary | ICD-10-CM

## 2020-10-09 DIAGNOSIS — I2510 Atherosclerotic heart disease of native coronary artery without angina pectoris: Secondary | ICD-10-CM

## 2020-10-09 DIAGNOSIS — I10 Essential (primary) hypertension: Secondary | ICD-10-CM

## 2020-10-09 DIAGNOSIS — F1010 Alcohol abuse, uncomplicated: Secondary | ICD-10-CM

## 2020-10-09 DIAGNOSIS — K769 Liver disease, unspecified: Secondary | ICD-10-CM

## 2020-10-09 NOTE — Patient Instructions (Signed)
Thank you, [name] for allowing [place] to provide your care today. Today we discussed   Heart failure Glad to see that you are feeling better since her hospitalization and able to do more.  Sorry to hear that you are having these episodes of lightheadedness and dizziness, if they persist please call our clinic to be seen.  Please continue to follow-up with the heart care team.  If you feel as though you are unable to afford any of the medications please give our clinic a call and we will try to assist you to get these medications to you as soon as possible.  I do recommend the COVID vaccination.  With all of your medical conditions you are at high risk for severe complications from this if you do get COVID.  I have ordered the following labs for you:  Lab Orders  No laboratory test(s) ordered today     Tests ordered today:  None  Referrals ordered today:   Referral Orders  No referral(s) requested today     I have ordered the following medication/changed the following medications:   Stop the following medications: There are no discontinued medications.   Start the following medications: No orders of the defined types were placed in this encounter.    Follow up:  As needed      Should you have any questions or concerns please call the internal medicine clinic at [phone].     Juan Kent, D.O. [facility]

## 2020-10-10 ENCOUNTER — Encounter: Payer: Self-pay | Admitting: Student

## 2020-10-10 DIAGNOSIS — Z0000 Encounter for general adult medical examination without abnormal findings: Secondary | ICD-10-CM | POA: Insufficient documentation

## 2020-10-10 NOTE — Assessment & Plan Note (Addendum)
Assessment: Recently diagnosed with severely reduced ejection fraction, EF of 20-25%. He had a 7 day hospitalization where he tolerated diuresis well. He had an ischemic evaluation with left heart catheterization which showed nonobstructive disease and he was started on medical management of entresto, lasix, ardiance, spironolactone, digoxin, and carvedilol. He is following with cardiology regularly and they are is following with the heart failure team. HF financial assistance is helping patient with cost of medical care and therapy.   Upon my evaluation today, patient states he feels the best he has in a long time. He has yet to try and sleep flat, he continues to sleep in a recliner. 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 He is also limiting how much he lifts.   Saw HF team two days ago and his entresto was increased and lasix decreased secondary to hyperkalemia and hypertension.   Plan: -Continue Entresto 100, Spiro 12.5 mg, Lasix 20 mg daily, dig .125 mg, jardiance 10 mg daily.  -Continue to follow with the heart failure team

## 2020-10-10 NOTE — Assessment & Plan Note (Signed)
Assessment: Found to have pulmonary hypertension on heart catheterization during his recent admission. Patient endorsed episodes of waking up in the middle of the night from his sleep gasping for breaths. Suspect some component of sleep apnea. Once patient's medicaid is approved, plan to order sleep study. If he does have OSA, may also be contributing to his HTN.   Plan: -continue to monitor

## 2020-10-10 NOTE — Assessment & Plan Note (Addendum)
Assessment: BP of 131/83. Patient has a BP cuff at home that is his mothers that she has had for many years. He has checked his pressure at homes and states it is quite high, 170's/80's. He was seen by HF team two days ago and his entresto was increased to 100 and his lasix was decreased 2/2 to hyperkalemia.   Today, patient's pressure seems to be well controlled. Discussed with patient to check BP's 1-2 times a week and records weights with these. Also recommended he purchase a new blood pressure cuff and bring it with him on subsequent visits to calibrate.  Plan: - Continue entresto 100, coreg 12.5, spiro 12.5 mg daily, and lasix 20 mg daily. - Continue to record BP's and follow with HF team

## 2020-10-10 NOTE — Assessment & Plan Note (Addendum)
Patient denies alcohol use since his discharge. Congratulated him on this

## 2020-10-10 NOTE — Assessment & Plan Note (Signed)
Patient has received two COVID vaccines and a booster. Is unsure of the dates at this time. Has yet to receive his 4th booster.   Patient with many financial difficulties, he does not meet requirements for medicaid. Will be approved for medicare in one year. HF team financial assistance is helping patient with current medical bills and making sure he receives medications. Currently working on CAFA per their most recent documentation.

## 2020-10-10 NOTE — Assessment & Plan Note (Signed)
Continue statin, aspirin 

## 2020-10-10 NOTE — Progress Notes (Signed)
CC: CHF, Hospital Follow up, Establish Care  HPI:  Juan Kent is a 65 y.o. male with a past medical history stated below and presents today for hospital follow up after a new diagnosis of systolic heart failure, he is also here to establish care. Please see problem based assessment and plan for additional details.  Past Medical History:  Diagnosis Date   Abnormal finding on imaging of liver    Hypertension    Non-ischemic cardiomyopathy (HCC)    Pulmonary hypertension (HCC)    Systolic heart failure (HCC)    Tobacco use disorder     Current Outpatient Medications on File Prior to Visit  Medication Sig Dispense Refill   aspirin 81 MG chewable tablet Chew 1 tablet (81 mg total) by mouth daily. 30 tablet 0   atorvastatin (LIPITOR) 40 MG tablet Take 1 tablet (40 mg total) by mouth daily. 30 tablet 0   buPROPion (WELLBUTRIN SR) 150 MG 12 hr tablet Take 1 tablet (150 mg total) by mouth daily. 30 tablet 0   carvedilol (COREG) 12.5 MG tablet Take 1 tablet (12.5 mg total) by mouth 2 (two) times daily with a meal. 30 tablet 3   digoxin (LANOXIN) 0.125 MG tablet Take 1 tablet (0.125 mg total) by mouth daily. 30 tablet 0   empagliflozin (JARDIANCE) 10 MG TABS tablet Take 1 tablet (10 mg total) by mouth daily. 30 tablet 0   furosemide (LASIX) 20 MG tablet Take 1 tablet (20 mg total) by mouth daily. 30 tablet 3   sacubitril-valsartan (ENTRESTO) 49-51 MG Take 1 tablet by mouth 2 (two) times daily. 180 tablet 3   spironolactone (ALDACTONE) 25 MG tablet Take 0.5 tablets (12.5 mg total) by mouth daily. 30 tablet 0   No current facility-administered medications on file prior to visit.    Family History  Problem Relation Age of Onset   Cancer Father     Social History   Socioeconomic History   Marital status: Widowed    Spouse name: Not on file   Number of children: Not on file   Years of education: Not on file   Highest education level: Not on file  Occupational History   Not on file   Tobacco Use   Smoking status: Every Day    Pack years: 0.00    Types: Cigarettes   Smokeless tobacco: Never  Substance and Sexual Activity   Alcohol use: Yes   Drug use: Never   Sexual activity: Not on file  Other Topics Concern   Not on file  Social History Narrative   Not on file   Social Determinants of Health   Financial Resource Strain: High Risk   Difficulty of Paying Living Expenses: Hard  Food Insecurity: Food Insecurity Present   Worried About Running Out of Food in the Last Year: Sometimes true   Ran Out of Food in the Last Year: Sometimes true  Transportation Needs: No Transportation Needs   Lack of Transportation (Medical): No   Lack of Transportation (Non-Medical): No  Physical Activity: Not on file  Stress: Not on file  Social Connections: Not on file  Intimate Partner Violence: Not on file    Review of Systems: ROS negative except for what is noted on the assessment and plan.  Vitals:   10/09/20 1024 10/09/20 1026  BP: (!) 144/86 131/83  Pulse: 93   Temp: 98.4 F (36.9 C)   TempSrc: Oral   SpO2: 97%   Weight: 162 lb 4.8 oz (73.6 kg)  Height: 5\' 5"  (1.651 m)    Physical Exam: Constitutional: well-appearing, in no acute distress HENT: normocephalic atraumatic Eyes: conjunctiva non-erythematous Neck: supple Cardiovascular: regular rate and rhythm, no m/r/g. No lower extremity edema present Pulmonary/Chest: normal work of breathing on room air, lungs clear to auscultation bilaterally MSK: normal bulk and tone Neurological: alert & oriented x 3, 5/5 strength in bilateral upper and lower extremities, normal gait Skin: warm and dry Psych: Normal mood and thought process  Assessment & Plan:   See Encounters Tab for problem based charting.  Patient discussed with Dr. , D.O. [facility], PGY-2 Pager: [phone], Phone: [phone] Date 10/10/2020 Time 7:14 PM

## 2020-10-10 NOTE — Assessment & Plan Note (Signed)
Assessment: Evidence of liver changes on imaging during admission. No signs of portal hypertension or decreased synthetic hepatic function  Plan: -continue to monitor -continue to encourage alcohol cessation

## 2020-10-10 NOTE — Assessment & Plan Note (Addendum)
Assessment: Patient with extensive tobacco use history. During his admission, many discussions were had concerning smoking cessation. He was started on wellbutrin 150 mg daily. Currently smoking 1 cigarette per day. He is working on completely cutting them out. Congratulated him on this and encouraged him to reach out for any further assistance with this matter.   Plan: -continue to encourage cessation -continue wellbutrin 150 mg daily

## 2020-10-13 ENCOUNTER — Other Ambulatory Visit: Payer: Self-pay

## 2020-10-13 ENCOUNTER — Ambulatory Visit (HOSPITAL_COMMUNITY)
Admission: RE | Admit: 2020-10-13 | Discharge: 2020-10-13 | Disposition: A | Discharge status: Home / Self Care | Payer: Self-pay | Source: Ambulatory Visit | Attending: Cardiology | Admitting: Cardiology

## 2020-10-13 DIAGNOSIS — I5022 Chronic systolic (congestive) heart failure: Secondary | ICD-10-CM | POA: Insufficient documentation

## 2020-10-13 DIAGNOSIS — I428 Other cardiomyopathies: Secondary | ICD-10-CM | POA: Insufficient documentation

## 2020-10-13 LAB — BASIC METABOLIC PANEL
Anion gap: 10 (ref 5–15)
BUN: 17 mg/dL (ref 8–23)
CO2: 23 mmol/L (ref 22–32)
Calcium: 9.6 mg/dL (ref 8.9–10.3)
Chloride: 106 mmol/L (ref 98–111)
Creatinine, Ser: 1.12 mg/dL (ref 0.61–1.24)
GFR, Estimated: >60 mL/min (ref >60)
Glucose, Bld: 141 mg/dL — ABNORMAL HIGH (ref 70–99)
Potassium: 3.9 mmol/L (ref 3.5–5.1)
Sodium: 139 mmol/L (ref 135–145)

## 2020-10-16 ENCOUNTER — Telehealth (HOSPITAL_COMMUNITY): Payer: Self-pay | Admitting: Licensed Clinical Social Worker

## 2020-10-16 NOTE — Telephone Encounter (Signed)
CSW contacted patient to inform that CAFA application and Good Rx card will be in the mail for him to complete and return. CSW reviewed application process and offered any assistance if needed. Patient grateful for the support and will call if needed. Lasandra Beech, LCSW, CCSW-MCS [phone]

## 2020-10-19 NOTE — Progress Notes (Signed)
Internal Medicine Clinic Attending  Case discussed with Dr. Katsadouros  At the time of the visit.  We reviewed the resident's history and exam and pertinent patient test results.  I agree with the assessment, diagnosis, and plan of care documented in the resident's note.  

## 2020-10-29 NOTE — Progress Notes (Signed)
Office Visit    Patient Name: Juan Kent Date of Encounter: 10/30/2020  PCP:  Aviva Kluver   [facility]  Cardiologist:  Parke Poisson, MD  Advanced Practice Provider:  No care team member to display Electrophysiologist:  None    Chief Complaint    Juan Kent is a 65 y.o. male with a hx of hypertension, tobacco use, hyperlipidemia, GERD, psoriasis, systolic and diastolic heart failure, CAD, pulmonary hypertension, alcohol use, tobacco use, cirrhosispresents today for heart failure follow-up  Past Medical History    Past Medical History:  Diagnosis Date   Abnormal finding on imaging of liver    Hypertension    Non-ischemic cardiomyopathy (HCC)    Pulmonary hypertension (HCC)    Systolic heart failure (HCC)    Tobacco use disorder    Past Surgical History:  Procedure Laterality Date   RIGHT/LEFT HEART CATH AND CORONARY ANGIOGRAPHY N/A 09/25/2020   Procedure: RIGHT/LEFT HEART CATH AND CORONARY ANGIOGRAPHY;  Surgeon: Lyn Records, MD;  Location: [facility];  Service: Cardiovascular;  Laterality: N/A;    Allergies  No Known Allergies  History of Present Illness    Juan Kent is a 65 y.o. male with a hx of hypertension, tobacco use, hyperlipidemia, GERD, psoriasis, systolic and diastolic heart failure , CAD, pulmonary hypertension, alcohol use, tobacco use, cirrhosis last seen 10/06/2020 by heart and vascular transitions of care clinic.  He was admitted 09/21/2020 with acute heart failure.  No cardiac history noted by DR day but did note some degree of symptoms over the previous 2 years.  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  Echocardiogram during admission showed LVEF 20 to 25%, grade 3 diastolic dysfunction, mildly reduced RV function, moderate MR and TR.  He underwent cardiac catheterization showing diffuse nonobstructive coronary atherosclerosis involving LAD,  circumflex, RCA with dominant RCA anatomy.  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  GDMT titrated during admission he was discharged on Jardiance, Entresto, Aldactone, carvedilol, digoxin, Lasix.  At follow-up with transitions of care clinic 10/07/2020 he was feeling overall well since discharge.  Noted occasional episodes of dizziness.  Home blood pressure 140s to 150s over 100s.  He cut back to smoking 1 pack/week and reduced alcohol intake significantly.  His Entresto was increased to 49/51 mg twice daily, Lasix reduced to 20mg  daily, Carvedilol increased to 12.5mg  BID, Spironolactone held due to K of 4.2.   He presents today for follow-up. 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 He reports no near syncope nor syncope. His dizziness has improved since changing his medications. Reports no chest pain, pressure, tightness. No orthopnea nor PND. He sleeps in a recliner which he has done for many years since back surgery. Blood pressure at home has been 160/100 prior to medications and 140s afterward. Takes his medicine in the morning around 9am. His evening doses are around 8pm. He is smoking only one or two cigarettes per day. He has not been drinking alcohol at all.   EKGs/Labs/Other Studies Reviewed:   The following studies were reviewed today:  Echo 09/22/20  1. Left ventricular ejection fraction, by estimation, is 20 to 25%. The  left ventricle has severely decreased function. The left ventricle  demonstrates global hypokinesis. The left ventricular internal cavity size  was mildly dilated. There is mild left  ventricular hypertrophy. Left ventricular diastolic parameters  are  consistent with Grade III diastolic dysfunction (restrictive). Elevated  left atrial pressure.   2. Right ventricular systolic function is mildly reduced. The right  ventricular size is normal. There is moderately elevated pulmonary artery  systolic pressure.    3. Left atrial size was moderately dilated.   4. Right atrial size was moderately dilated.   5. The mitral valve is normal in structure. Mild to moderate mitral valve  regurgitation. No evidence of mitral stenosis.   6. Tricuspid valve regurgitation is moderate.   7. The aortic valve is tricuspid. Aortic valve regurgitation is trivial.  No aortic stenosis is present.   8. The inferior vena cava is dilated in size with <50% respiratory  variability, suggesting right atrial pressure of 15 mmHg.   R/LHC 09/25/20 Diffuse nonobstructive coronary atherosclerosis involving LAD, circumflex, and RCA.  Dominant RCA anatomy. Nonischemic cardiomyopathy with decompensated left heart failure. Moderate pulmonary hypertension with mean pressure 36 mmHg.  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 LVEDP 26 mmHg. Cardiac output based upon a PA saturation of 65% is 3.7 L/min and cardiac index 2.0 L/min/m.   RECOMMENDATIONS:   Guideline directed therapy for systolic heart failure. Abstinence from tobacco and alcohol use. Still volume overloaded, needs more aggressive diuresis as tolerated by kidney function and blood pressure.Diffuse nonobstructive coronary atherosclerosis involving LAD, circumflex, and RCA.  Dominant RCA anatomy. Nonischemic cardiomyopathy with decompensated left heart failure. Moderate pulmonary hypertension with mean pressure 36 mmHg.  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 LVEDP 26 mmHg. Cardiac output based upon a PA saturation of 65% is 3.7 L/min and cardiac index 2.0 L/min/m.   RECOMMENDATIONS:   Guideline directed therapy for systolic heart failure. Abstinence from tobacco and alcohol use. Still volume overloaded, needs more aggressive diuresis as tolerated by kidney function and blood pressure.  EKG: None ordered today  Recent Labs: 09/21/2020: TSH 2.713 09/23/2020: ALT 77 09/25/2020: Hemoglobin 16.0; Platelets 332 09/28/2020: Magnesium 2.4 10/06/2020: B Natriuretic  Peptide 146.4 10/13/2020: BUN 17; Creatinine, Ser 1.12; Potassium 3.9; Sodium 139  Recent Lipid Panel    Component Value Date/Time   CHOL 174 09/21/2020 1855   TRIG 117 09/21/2020 1855   HDL 17 (L) 09/21/2020 1855   CHOLHDL 10.2 09/21/2020 1855   VLDL 23 09/21/2020 1855   LDLCALC 134 (H) 09/21/2020 1855    Home Medications   Current Meds  Medication Sig   aspirin 81 MG chewable tablet Chew 1 tablet (81 mg total) by mouth daily.   sacubitril-valsartan (ENTRESTO) 49-51 MG Take 1 tablet by mouth 2 (two) times daily.   spironolactone (ALDACTONE) 25 MG tablet Take 25 mg by mouth daily.   [DISCONTINUED] atorvastatin (LIPITOR) 40 MG tablet Take 1 tablet (40 mg total) by mouth daily.   [DISCONTINUED] buPROPion (WELLBUTRIN SR) 150 MG 12 hr tablet Take 1 tablet (150 mg total) by mouth daily.   [DISCONTINUED] carvedilol (COREG) 12.5 MG tablet Take 1 tablet (12.5 mg total) by mouth 2 (two) times daily with a meal.   [DISCONTINUED] digoxin (LANOXIN) 0.125 MG tablet Take 1 tablet (0.125 mg total) by mouth daily.   [DISCONTINUED] empagliflozin (JARDIANCE) 10 MG TABS tablet Take 1 tablet (10 mg total) by mouth daily.   [DISCONTINUED] furosemide (LASIX) 20 MG tablet Take 1 tablet (20 mg total) by mouth daily.     Review of Systems      All other systems reviewed and are otherwise negative except as noted above.  Physical Exam    VS:  BP (!) 164/103  Pulse 76   Ht 5\' 6"  (1.676 m)   Wt 165 lb (74.8 kg)   SpO2 97%   BMI 26.63 kg/m  , BMI Body mass index is 26.63 kg/m.  Wt Readings from Last 3 Encounters:  10/30/20 165 lb (74.8 kg)  10/09/20 162 lb 4.8 oz (73.6 kg)  10/06/20 161 lb 12.8 oz (73.4 kg)    GEN: Well nourished, well developed, in no acute distress. HEENT: normal. Neck: Supple, no JVD, carotid bruits, or masses. Cardiac: RRR, no murmurs, rubs, or gallops. No clubbing, cyanosis, edema.  Radials/PT 2+ and equal bilaterally.  Respiratory:  Respirations regular and unlabored,  clear to auscultation bilaterally. GI: Soft, nontender, nondistended. MS: No deformity or atrophy. Skin: Warm and dry, no rash. Neuro:  Strength and sensation are intact. Psych: Normal affect.  Assessment & Plan    Chronic, systolic and diastolic heart failure /moderate pulmonary hypertension / -likely alcohol and DES to cardiomyopathy.  LVEF 20-25%.Repeat echocardiogram in 3 months to reassess LVEF.  If LVEF remains less than 35% refer to EP for ICD. Euvolemic and well compensated on exam.  GDMT includes Lasix 20 mg daily, Coreg 12.5 mg twice daily, Jardiance 10 mg daily, digoxin 0.125 mg daily, Entresto 49-51 mg twice daily.  We will resume spironolactone 25 mg daily.  Repeat BMP in 1 week.  If potassium is noted to be elevated again, will discontinue spironolactone. Heart healthy diet and regular cardiovascular exercise encouraged.    Medication management -Jardiance samples provided.  He is working with heart and vascular social work to obtain medications at a reasonable cost.  He plans to enroll in 12/07/20 when he qualifies in the February.  Refills provided of his cardiac medications today.  Nonobstructive CAD - By cath 09/2020. Stable with no anginal symptoms. No indication for ischemic evaluation.  GDMT includes aspirin, carvedilol, atorvastatin.  High risk medication use - On digoxin due to heart failure.  Will order digoxin level.  No signs of toxicity.  HLD, LDL goal less than 70 -continue atorvastatin 40 mg daily.  Plan for repeat lipid panel at follow-up.  Not at goal of less than 70 further escalate dose versus adding Zetia.  Cirrhosis - Continue to follow with PCP. Congratulated on quitting alcohol use.  Tobacco use / Alcohol use -he has reduced to 1 to 2 cigarettes/day.  Congratulated on reduced dose.  He will complete 2 more months of Wellbutrin to complete 65-month course.  He is completely abstained from alcohol and congratulated.  HTN -BP elevated today though notes he did  not take his medications today yet.  Blood pressure at home 160s prior to morning medications and 140s afterward.  He continues to note mild orthostatic hypotension which is overall improving.  Will need careful titration of antihypertensive agents.  As above, resume spironolactone.  Consider further increasing dose of Entresto at follow-up.  Disposition: Follow up in 1 month(s) with Dr. 2-month or APP.  Signed, Jacques Navy, NP 10/30/2020, 1:21 PM [facility]

## 2020-10-30 ENCOUNTER — Other Ambulatory Visit: Payer: Self-pay

## 2020-10-30 ENCOUNTER — Ambulatory Visit (INDEPENDENT_AMBULATORY_CARE_PROVIDER_SITE_OTHER): Payer: Self-pay | Admitting: Family

## 2020-10-30 ENCOUNTER — Encounter (HOSPITAL_BASED_OUTPATIENT_CLINIC_OR_DEPARTMENT_OTHER): Payer: Self-pay | Admitting: Family

## 2020-10-30 VITALS — BP 164/103 | HR 76 | Ht 66.0 in | Wt 165.0 lb

## 2020-10-30 DIAGNOSIS — F1090 Alcohol use, unspecified, uncomplicated: Secondary | ICD-10-CM

## 2020-10-30 DIAGNOSIS — I10 Essential (primary) hypertension: Secondary | ICD-10-CM

## 2020-10-30 DIAGNOSIS — Z789 Other specified health status: Secondary | ICD-10-CM

## 2020-10-30 DIAGNOSIS — Z7289 Other problems related to lifestyle: Secondary | ICD-10-CM

## 2020-10-30 DIAGNOSIS — Z720 Tobacco use: Secondary | ICD-10-CM

## 2020-10-30 DIAGNOSIS — E785 Hyperlipidemia, unspecified: Secondary | ICD-10-CM

## 2020-10-30 DIAGNOSIS — I25118 Atherosclerotic heart disease of native coronary artery with other forms of angina pectoris: Secondary | ICD-10-CM

## 2020-10-30 DIAGNOSIS — I5022 Chronic systolic (congestive) heart failure: Secondary | ICD-10-CM

## 2020-10-30 MED ORDER — FUROSEMIDE 20 MG PO TABS: 20.0000 mg | ORAL_TABLET | Freq: Every day | ORAL | 5 refills | Status: DC

## 2020-10-30 MED ORDER — ATORVASTATIN CALCIUM 40 MG PO TABS: 40.0000 mg | ORAL_TABLET | Freq: Every day | ORAL | 5 refills | Status: DC

## 2020-10-30 MED ORDER — EMPAGLIFLOZIN 10 MG PO TABS: 10.0000 mg | ORAL_TABLET | Freq: Every day | ORAL | 5 refills | Status: DC

## 2020-10-30 MED ORDER — BUPROPION HCL ER (SR) 150 MG PO TB12: 150.0000 mg | ORAL_TABLET | Freq: Every day | ORAL | 1 refills | Status: DC

## 2020-10-30 MED ORDER — CARVEDILOL 12.5 MG PO TABS: 12.5000 mg | ORAL_TABLET | Freq: Two times a day (BID) | ORAL | 5 refills | Status: DC

## 2020-10-30 MED ORDER — DIGOXIN 125 MCG PO TABS: 0.1250 mg | ORAL_TABLET | Freq: Every day | ORAL | 5 refills | Status: DC

## 2020-10-30 NOTE — Patient Instructions (Signed)
Medication Instructions:  Your physician has recommended you make the following change in your medication:    RESUME Spironolactone (Aldactone) one 25mg  tablet daily  *If you need a refill on your cardiac medications before your next appointment, please call your pharmacy*   Lab Work: Your physician recommends that you return for lab work next week at your convenience: digoxin level, BMP  If you have labs (blood work) drawn today and your tests are completely normal, you will receive your results only by: MyChart Message (if you have MyChart) OR A paper copy in the mail If you have any lab test that is abnormal or we need to change your treatment, we will call you to review the results.   Testing/Procedures: Your physician has requested that you have an echocardiogram in 3 months. Echocardiography is a painless test that uses sound waves to create images of your heart. It provides your doctor with information about the size and shape of your heart and how well your heart's chambers and valves are working. This procedure takes approximately one hour. There are no restrictions for this procedure.    Follow-Up: At [facility], you and your health needs are our priority.  As part of our continuing mission to provide you with exceptional heart care, we have created designated Provider Care Teams.  These Care Teams include your primary Cardiologist (physician) and Advanced Practice Providers (APPs -  Physician Assistants and Nurse Practitioners) who all work together to provide you with the care you need, when you need it.  We recommend signing up for the patient portal called "MyChart".  Sign up information is provided on this After Visit Summary.  MyChart is used to connect with patients for Virtual Visits (Telemedicine).  Patients are able to view lab/test results, encounter notes, upcoming appointments, etc.  Non-urgent messages can be sent to your provider as well.   To learn more about  what you can do with MyChart, go to [facility].    Your next appointment:   12/10/20 at 8:20 AM with Dr. 02/09/21   Other Instructions  Heart Healthy Diet Recommendations: A low-salt diet is recommended. Meats should be grilled, baked, or boiled. Avoid fried foods. Focus on lean protein sources like fish or chicken with vegetables and fruits. The American Heart Association is a Jacques Navy!    Exercise recommendations: The American Heart Association recommends 150 minutes of moderate intensity exercise weekly. Try 30 minutes of moderate intensity exercise 4-5 times per week. This could include walking, jogging, or swimming.

## 2020-11-19 ENCOUNTER — Ambulatory Visit: Payer: Self-pay | Admitting: Nurse Practitioner

## 2020-11-20 ENCOUNTER — Encounter (HOSPITAL_COMMUNITY): Payer: Self-pay | Admitting: Cardiology

## 2020-11-20 ENCOUNTER — Ambulatory Visit (HOSPITAL_COMMUNITY): Payer: MEDICAID | Attending: Family

## 2020-11-20 ENCOUNTER — Encounter (HOSPITAL_COMMUNITY): Payer: Self-pay | Admitting: Family

## 2020-11-27 ENCOUNTER — Other Ambulatory Visit (HOSPITAL_COMMUNITY): Payer: Self-pay

## 2020-12-03 ENCOUNTER — Telehealth (HOSPITAL_COMMUNITY): Payer: Self-pay | Admitting: Family

## 2020-12-03 NOTE — Telephone Encounter (Signed)
Just an FYI. We have made several attempts to contact this patient including sending a letter to schedule or reschedule their echocardiogram. We will be removing the patient from the echo WQ.  Notes  11/20/20 NO SHOWED-MAILED LETTER [facility]  10/30/20 [facility] to schedule @ 11:46/[facility]  10/30/20 patient did not come to check out kf       Thank you

## 2020-12-10 ENCOUNTER — Ambulatory Visit (INDEPENDENT_AMBULATORY_CARE_PROVIDER_SITE_OTHER): Payer: Self-pay | Admitting: Internal Medicine

## 2020-12-10 ENCOUNTER — Other Ambulatory Visit: Payer: Self-pay

## 2020-12-10 ENCOUNTER — Encounter: Payer: Self-pay | Admitting: Internal Medicine

## 2020-12-10 VITALS — BP 174/76 | HR 74 | Ht 66.0 in | Wt 169.0 lb

## 2020-12-10 DIAGNOSIS — F1090 Alcohol use, unspecified, uncomplicated: Secondary | ICD-10-CM

## 2020-12-10 DIAGNOSIS — I5022 Chronic systolic (congestive) heart failure: Secondary | ICD-10-CM

## 2020-12-10 DIAGNOSIS — I5023 Acute on chronic systolic (congestive) heart failure: Secondary | ICD-10-CM

## 2020-12-10 DIAGNOSIS — Z789 Other specified health status: Secondary | ICD-10-CM

## 2020-12-10 DIAGNOSIS — I272 Pulmonary hypertension, unspecified: Secondary | ICD-10-CM

## 2020-12-10 DIAGNOSIS — I428 Other cardiomyopathies: Secondary | ICD-10-CM

## 2020-12-10 DIAGNOSIS — Z7289 Other problems related to lifestyle: Secondary | ICD-10-CM

## 2020-12-10 DIAGNOSIS — Z5181 Encounter for therapeutic drug level monitoring: Secondary | ICD-10-CM

## 2020-12-10 DIAGNOSIS — I25118 Atherosclerotic heart disease of native coronary artery with other forms of angina pectoris: Secondary | ICD-10-CM

## 2020-12-10 DIAGNOSIS — I10 Essential (primary) hypertension: Secondary | ICD-10-CM

## 2020-12-10 DIAGNOSIS — Z720 Tobacco use: Secondary | ICD-10-CM

## 2020-12-10 DIAGNOSIS — E785 Hyperlipidemia, unspecified: Secondary | ICD-10-CM

## 2020-12-10 MED ORDER — EMPAGLIFLOZIN 10 MG PO TABS: 10.0000 mg | ORAL_TABLET | Freq: Every day | ORAL | 6 refills | Status: DC

## 2020-12-10 NOTE — Patient Instructions (Addendum)
Medication Instructions:  RESTART: ENTRESTO 49/51 TWICE DAILY JARDIANCE HAS BEEN REFILLED  *If you need a refill on your cardiac medications before your next appointment, please call your pharmacy*  Lab Work: LIPIDS, BMET, DIGOXIN LEVEL TODAY If you have labs (blood work) drawn today and your tests are completely normal, you will receive your results only by: MyChart Message (if you have MyChart) OR A paper copy in the mail If you have any lab test that is abnormal or we need to change your treatment, we will call you to review the results.  Testing/Procedures: Your physician has requested that you have an echocardiogram BEFORE NEXT APPOINTMENT . Echocardiography is a painless test that uses sound waves to create images of your heart. It provides your doctor with information about the size and shape of your heart and how well your heart's chambers and valves are working. You may receive an ultrasound enhancing agent through an IV if needed to better visualize your heart during the echo.This procedure takes approximately one hour. There are no restrictions for this procedure. This will take place at the [address], [address].   Follow-Up: At [facility], you and your health needs are our priority.  As part of our continuing mission to provide you with exceptional heart care, we have created designated Provider Care Teams.  These Care Teams include your primary Cardiologist (physician) and Advanced Practice Providers (APPs -  Physician Assistants and Nurse Practitioners) who all work together to provide you with the care you need, when you need it.  We recommend signing up for the patient portal called "MyChart".  Sign up information is provided on this After Visit Summary.  MyChart is used to connect with patients for Virtual Visits (Telemedicine).  Patients are able to view lab/test results, encounter notes, upcoming appointments, etc.  Non-urgent messages can be sent to your provider as  well.   To learn more about what you can do with MyChart, go to [email or web address].    Your next appointment:   October 18th 2022 at 8:20am  The format for your next appointment:   In Person  Provider:   Weston Brass, MD

## 2020-12-10 NOTE — Progress Notes (Signed)
Cardiology Office Note:    Date:  12/10/2020   ID:  Valetta Fuller, DOB 1956/02/21, MRN [id number]  PCP:  Pcp, No  Cardiologist:  Parke Poisson, MD  Electrophysiologist:  None   Referring MD: No ref. provider found   Chief Complaint/Reason for Referral: HFrEF  History of Present Illness:    Morgen Ritacco is a 65 y.o. male with a history of hypertension, tobacco use, hyperlipidemia, GERD, psoriasis, systolic and diastolic heart failure, CAD, pulmonary hypertension, alcohol use, tobacco use, cirrhosis who presents for follow up.   Works at [employer] on [employer] for many years. Going well but busy and short staffed.  Seen by Gillian Shields NP in end July, doing well on medical therapy. Stopped ETOH and down to 1-2 cig per day.   CM felt 2/2 to ETOH and HTN. Nonobstructive CAD on cath.   Class I-II symptoms at this time. The patient denies chest pain, chest pressure, dyspnea at rest or with exertion, palpitations, PND, orthopnea, or leg swelling. Denies cough, fever, chills. Denies nausea, vomiting. Denies syncope or presyncope. Denies dizziness or lightheadedness.   Past Medical History:  Diagnosis Date   Abnormal finding on imaging of liver    Hypertension    Non-ischemic cardiomyopathy (HCC)    Pulmonary hypertension (HCC)    Systolic heart failure (HCC)    Tobacco use disorder     Past Surgical History:  Procedure Laterality Date   RIGHT/LEFT HEART CATH AND CORONARY ANGIOGRAPHY N/A 09/25/2020   Procedure: RIGHT/LEFT HEART CATH AND CORONARY ANGIOGRAPHY;  Surgeon: Lyn Records, MD;  [facility];  Service: Cardiovascular;  Laterality: N/A;    Current Medications: Current Meds  Medication Sig   aspirin 81 MG chewable tablet Chew 1 tablet (81 mg total) by mouth daily.   atorvastatin (LIPITOR) 40 MG tablet Take 1 tablet (40 mg total) by mouth daily.   buPROPion (WELLBUTRIN SR) 150 MG 12 hr tablet Take 1 tablet (150 mg total) by mouth daily.   carvedilol (COREG) 12.5 MG  tablet Take 1 tablet (12.5 mg total) by mouth 2 (two) times daily with a meal.   digoxin (LANOXIN) 0.125 MG tablet Take 1 tablet (0.125 mg total) by mouth daily.   furosemide (LASIX) 20 MG tablet Take 1 tablet (20 mg total) by mouth daily.   sacubitril-valsartan (ENTRESTO) 49-51 MG Take 1 tablet by mouth 2 (two) times daily.   spironolactone (ALDACTONE) 25 MG tablet Take 25 mg by mouth daily.   [DISCONTINUED] empagliflozin (JARDIANCE) 10 MG TABS tablet Take 1 tablet (10 mg total) by mouth daily.     Allergies:   Patient has no known allergies.   Social History   Tobacco Use   Smoking status: Every Day    Types: Cigarettes   Smokeless tobacco: Never  Substance Use Topics   Alcohol use: Yes   Drug use: Never     Family History: The patient's family history includes Cancer in his father.  ROS:   Please see the history of present illness.    All other systems reviewed and are negative.  EKGs/Labs/Other Studies Reviewed:    The following studies were reviewed today:  EKG:  NSR, LAFB, inferior and anteroseptal infarct pattern, ST/T wave abnl lateral.  Imaging studies that I have independently reviewed today: LHC 09/25/20, nonobstructive CAD  Recent Labs: 09/21/2020: TSH 2.713 09/23/2020: ALT 77 09/25/2020: Hemoglobin 16.0; Platelets 332 09/28/2020: Magnesium 2.4 10/06/2020: B Natriuretic Peptide 146.4 10/13/2020: BUN 17; Creatinine, Ser 1.12; Potassium 3.9;  Sodium 139  Recent Lipid Panel    Component Value Date/Time   CHOL 174 09/21/2020 1855   TRIG 117 09/21/2020 1855   HDL 17 (L) 09/21/2020 1855   CHOLHDL 10.2 09/21/2020 1855   VLDL 23 09/21/2020 1855   LDLCALC 134 (H) 09/21/2020 1855    Physical Exam:    VS:  BP (!) 174/76 (BP Location: Left Arm, Patient Position: Sitting, Cuff Size: Normal)   Pulse 74   Ht 5\' 6"  (1.676 m)   Wt 169 lb (76.7 kg)   BMI 27.28 kg/m     Wt Readings from Last 5 Encounters:  10/30/20 165 lb (74.8 kg)  10/09/20 162 lb 4.8 oz (73.6 kg)   10/06/20 161 lb 12.8 oz (73.4 kg)  09/28/20 159 lb 14.4 oz (72.5 kg)    Constitutional: No acute distress Eyes: sclera non-icteric, normal conjunctiva and lids ENMT: normal dentition, moist mucous membranes Cardiovascular: regular rhythm, normal rate, no murmurs. S1 and S2 normal. Radial pulses normal bilaterally. No jugular venous distention.  Respiratory: clear to auscultation bilaterally GI : normal bowel sounds, soft and nontender. No distention.   MSK: extremities warm, well perfused. No edema.  NEURO: grossly nonfocal exam, moves all extremities. PSYCH: alert and oriented x 3, normal mood and affect.   ASSESSMENT:    1. Chronic systolic CHF (congestive heart failure) (HCC)   2. Nonischemic cardiomyopathy (HCC)   3. Coronary artery disease of native artery of native heart with stable angina pectoris (HCC)   4. Hyperlipidemia LDL goal <70   5. Pulmonary hypertension (HCC)   6. Essential hypertension   7. Tobacco use   8. Alcohol use   9. Therapeutic drug monitoring    PLAN:    Chronic systolic CHF (congestive heart failure) (HCC) - Plan: EKG 12-Lead, Lipid panel, Basic metabolic panel, Digoxin level, empagliflozin (JARDIANCE) 10 MG TABS tablet Nonischemic cardiomyopathy (HCC) Drug monitoring-  Heart Failure Therapy ACE-I/ARB/ARNI: had stopped entresto, resume 49-51 mg BID BB: carvedilol 12.5 mg BID MRA: spironolactone 25 mg daily  SGLT2I: Jardience 10 gm daily, refill today.  Diuretic plan: lasix 20 mg daily.  - need repeat echo to evaluate EF, will coordinate.  - needs digoxin level for monitoring, continue digoxin 0.125 mg daily for now.   Coronary artery disease of native artery of native heart with stable angina pectoris (HCC) - nonobstructive, continue ASA 81 mg daily and atorvastatin 40 mg daily.  Hyperlipidemia LDL goal <70 - Plan: Lipid panel - needs repeat lipids, will continue atorvastatin 40 mg daily for now.   Pulmonary hypertension (HCC) - likely  secondary to left heart disease, continue medical therapy, repeat echo.   Essential hypertension - BP elevated today may improve with resumption of entresto.  Tobacco use Alcohol use - encouraged cessation.   Total time of encounter: 30 minutes total time of encounter, including 25 minutes spent in face-to-face patient care on the date of this encounter. This time includes coordination of care and counseling regarding above mentioned problem list. Remainder of non-face-to-face time involved reviewing chart documents/testing relevant to the patient encounter and documentation in the medical record. I have independently reviewed documentation from referring provider.   09/30/20, MD, [facility]  [facility]   Shared Decision Making/Informed Consent:       Medication Adjustments/Labs and Tests Ordered: Current medicines are reviewed at length with the patient today.  Concerns regarding medicines are outlined above.   Orders Placed This Encounter  Procedures   Lipid panel  Basic metabolic panel   Digoxin level   EKG 12-Lead    Meds ordered this encounter  Medications   empagliflozin (JARDIANCE) 10 MG TABS tablet    Sig: Take 1 tablet (10 mg total) by mouth daily.    Dispense:  30 tablet    Refill:  6    Patient Instructions  Medication Instructions:  RESTART: ENTRESTO 49/51 TWICE DAILY JARDIANCE HAS BEEN REFILLED  *If you need a refill on your cardiac medications before your next appointment, please call your pharmacy*  Lab Work: LIPIDS, BMET, DIGOXIN LEVEL TODAY If you have labs (blood work) drawn today and your tests are completely normal, you will receive your results only by: MyChart Message (if you have MyChart) OR A paper copy in the mail If you have any lab test that is abnormal or we need to change your treatment, we will call you to review the results.  Testing/Procedures: Your physician has requested that you have an echocardiogram BEFORE NEXT  APPOINTMENT . Echocardiography is a painless test that uses sound waves to create images of your heart. It provides your doctor with information about the size and shape of your heart and how well your heart's chambers and valves are working. You may receive an ultrasound enhancing agent through an IV if needed to better visualize your heart during the echo.This procedure takes approximately one hour. There are no restrictions for this procedure. This will take place at the [address], [address].   Follow-Up: At [facility], you and your health needs are our priority.  As part of our continuing mission to provide you with exceptional heart care, we have created designated Provider Care Teams.  These Care Teams include your primary Cardiologist (physician) and Advanced Practice Providers (APPs -  Physician Assistants and Nurse Practitioners) who all work together to provide you with the care you need, when you need it.  We recommend signing up for the patient portal called "MyChart".  Sign up information is provided on this After Visit Summary.  MyChart is used to connect with patients for Virtual Visits (Telemedicine).  Patients are able to view lab/test results, encounter notes, upcoming appointments, etc.  Non-urgent messages can be sent to your provider as well.   To learn more about what you can do with MyChart, go to [email or web address].    Your next appointment:   October 18th 2022 at 8:20am  The format for your next appointment:   In Person  Provider:   Weston Brass, MD

## 2020-12-11 LAB — BASIC METABOLIC PANEL
BUN/Creatinine Ratio: 15 (ref 10–24)
BUN: 15 mg/dL (ref 8–27)
CO2: 22 mmol/L (ref 20–29)
Calcium: 9.6 mg/dL (ref 8.6–10.2)
Chloride: 103 mmol/L (ref 96–106)
Creatinine, Ser: 0.97 mg/dL (ref 0.76–1.27)
Glucose: 103 mg/dL — ABNORMAL HIGH (ref 65–99)
Potassium: 4.5 mmol/L (ref 3.5–5.2)
Sodium: 142 mmol/L (ref 134–144)
eGFR: 87 mL/min/{1.73_m2} (ref >59)

## 2020-12-11 LAB — LIPID PANEL
Chol/HDL Ratio: 7.9 ratio — ABNORMAL HIGH (ref 0.0–5.0)
Cholesterol, Total: 205 mg/dL — ABNORMAL HIGH (ref 100–199)
HDL: 26 mg/dL — ABNORMAL LOW (ref >39)
LDL Chol Calc (NIH): 123 mg/dL — ABNORMAL HIGH (ref 0–99)
Triglycerides: 313 mg/dL — ABNORMAL HIGH (ref 0–149)
VLDL Cholesterol Cal: 56 mg/dL — ABNORMAL HIGH (ref 5–40)

## 2020-12-11 LAB — DIGOXIN LEVEL: Digoxin, Serum: 0.6 ng/mL (ref 0.5–0.9)

## 2020-12-27 ENCOUNTER — Other Ambulatory Visit (HOSPITAL_BASED_OUTPATIENT_CLINIC_OR_DEPARTMENT_OTHER): Payer: Self-pay | Admitting: Family

## 2020-12-27 DIAGNOSIS — I5022 Chronic systolic (congestive) heart failure: Secondary | ICD-10-CM

## 2020-12-27 DIAGNOSIS — Z720 Tobacco use: Secondary | ICD-10-CM

## 2021-01-19 ENCOUNTER — Ambulatory Visit: Payer: Self-pay | Admitting: Internal Medicine

## 2021-07-27 ENCOUNTER — Other Ambulatory Visit: Payer: Self-pay | Admitting: Internal Medicine

## 2021-07-27 DIAGNOSIS — Z720 Tobacco use: Secondary | ICD-10-CM

## 2021-07-30 ENCOUNTER — Encounter: Payer: Self-pay | Admitting: Gastroenterology

## 2021-08-18 ENCOUNTER — Ambulatory Visit
Admission: RE | Admit: 2021-08-18 | Discharge: 2021-08-18 | Disposition: A | Discharge status: Home / Self Care | Payer: Self-pay | Source: Ambulatory Visit | Attending: Internal Medicine | Admitting: Internal Medicine

## 2021-08-18 DIAGNOSIS — Z720 Tobacco use: Secondary | ICD-10-CM

## 2021-08-23 ENCOUNTER — Other Ambulatory Visit (HOSPITAL_COMMUNITY): Payer: Self-pay | Admitting: Radiology

## 2021-08-23 DIAGNOSIS — J432 Centrilobular emphysema: Secondary | ICD-10-CM

## 2021-08-24 ENCOUNTER — Ambulatory Visit: Payer: Self-pay | Admitting: Gastroenterology

## 2021-09-03 ENCOUNTER — Other Ambulatory Visit: Payer: Self-pay | Admitting: Internal Medicine

## 2021-09-08 ENCOUNTER — Other Ambulatory Visit: Payer: Self-pay | Admitting: Internal Medicine

## 2021-09-08 DIAGNOSIS — I714 Abdominal aortic aneurysm, without rupture, unspecified: Secondary | ICD-10-CM

## 2021-09-08 DIAGNOSIS — I723 Aneurysm of iliac artery: Secondary | ICD-10-CM

## 2021-09-09 ENCOUNTER — Telehealth (HOSPITAL_BASED_OUTPATIENT_CLINIC_OR_DEPARTMENT_OTHER): Payer: Self-pay | Admitting: *Deleted

## 2021-09-09 NOTE — Telephone Encounter (Signed)
Dr Oval Linsey received notification from [employer] that patient assistance for Delene Loll would be running out end of June  This is a Dr Margaretann Loveless patient, will forward to Belinda Block RN for review

## 2021-09-13 NOTE — Telephone Encounter (Signed)
Spoke with patient who states that he now has medicare and is able to get his [place] [employer] and his [employer]. Advised patient to call back with any concerns patient verbalized understanding.

## 2021-09-14 ENCOUNTER — Ambulatory Visit (HOSPITAL_COMMUNITY)
Admission: RE | Admit: 2021-09-14 | Discharge: 2021-09-14 | Disposition: A | Discharge status: Home / Self Care | Payer: PPO | Source: Ambulatory Visit | Attending: Internal Medicine | Admitting: Internal Medicine

## 2021-09-14 DIAGNOSIS — J432 Centrilobular emphysema: Secondary | ICD-10-CM | POA: Insufficient documentation

## 2021-09-14 LAB — PULMONARY FUNCTION TEST
DL/VA % pred: 62 %
DL/VA: 2.67 ml/min/mmHg/L
DLCO unc % pred: 60 %
DLCO unc: 13.31 ml/min/mmHg
FEF 25-75 Post: 1.13 L/sec
FEF 25-75 Pre: 2.2 L/sec
FEF2575-%Change-Post: -48 %
FEF2575-%Pred-Post: 51 %
FEF2575-%Pred-Pre: 100 %
FEV1-%Change-Post: -34 %
FEV1-%Pred-Post: 57 %
FEV1-%Pred-Pre: 87 %
FEV1-Post: 1.56 L
FEV1-Pre: 2.37 L
FEV1FVC-%Change-Post: -33 %
FEV1FVC-%Pred-Pre: 106 %
FEV6-%Change-Post: 1 %
FEV6-%Pred-Post: 85 %
FEV6-%Pred-Pre: 84 %
FEV6-Post: 2.94 L
FEV6-Pre: 2.91 L
FEV6FVC-%Pred-Post: 106 %
FEV6FVC-%Pred-Pre: 106 %
FVC-%Change-Post: -1 %
FVC-%Pred-Post: 80 %
FVC-%Pred-Pre: 81 %
FVC-Post: 2.94 L
FVC-Pre: 2.98 L
Post FEV1/FVC ratio: 53 %
Post FEV6/FVC ratio: 100 %
Pre FEV1/FVC ratio: 80 %
Pre FEV6/FVC Ratio: 100 %
RV % pred: 65 %
RV: 1.32 L
TLC % pred: 88 %
TLC: 5.12 L

## 2021-09-14 MED ORDER — ALBUTEROL SULFATE (2.5 MG/3ML) 0.083% IN NEBU
2.5000 mg | INHALATION_SOLUTION | Freq: Once | RESPIRATORY_TRACT | Status: AC
  Administered 2021-09-14: 2.5 mg via RESPIRATORY_TRACT

## 2021-09-30 ENCOUNTER — Ambulatory Visit
Admission: RE | Admit: 2021-09-30 | Discharge: 2021-09-30 | Disposition: A | Discharge status: Home / Self Care | Payer: PPO | Source: Ambulatory Visit | Attending: Internal Medicine | Admitting: Internal Medicine

## 2021-09-30 ENCOUNTER — Other Ambulatory Visit: Payer: Self-pay | Admitting: Internal Medicine

## 2021-09-30 DIAGNOSIS — I723 Aneurysm of iliac artery: Secondary | ICD-10-CM

## 2021-09-30 DIAGNOSIS — I714 Abdominal aortic aneurysm, without rupture, unspecified: Secondary | ICD-10-CM

## 2021-09-30 MED ORDER — IOPAMIDOL (ISOVUE-370) INJECTION 76%
80.0000 mL | Freq: Once | INTRAVENOUS | Status: AC | PRN
  Administered 2021-09-30: 80 mL via INTRAVENOUS

## 2021-12-01 ENCOUNTER — Ambulatory Visit: Payer: Self-pay | Admitting: Internal Medicine

## 2022-01-12 ENCOUNTER — Ambulatory Visit: Payer: PPO | Admitting: Internal Medicine

## 2022-04-19 ENCOUNTER — Ambulatory Visit: Payer: PPO | Attending: Internal Medicine | Admitting: Internal Medicine

## 2022-04-19 ENCOUNTER — Encounter: Payer: Self-pay | Admitting: Internal Medicine

## 2022-04-19 ENCOUNTER — Telehealth (HOSPITAL_COMMUNITY): Payer: Self-pay | Admitting: Vascular Surgery

## 2022-04-19 VITALS — BP 170/100 | HR 84 | Ht 64.0 in | Wt 179.2 lb

## 2022-04-19 DIAGNOSIS — E785 Hyperlipidemia, unspecified: Secondary | ICD-10-CM

## 2022-04-19 DIAGNOSIS — Z5181 Encounter for therapeutic drug level monitoring: Secondary | ICD-10-CM

## 2022-04-19 DIAGNOSIS — I5022 Chronic systolic (congestive) heart failure: Secondary | ICD-10-CM | POA: Diagnosis not present

## 2022-04-19 DIAGNOSIS — I10 Essential (primary) hypertension: Secondary | ICD-10-CM | POA: Diagnosis not present

## 2022-04-19 DIAGNOSIS — Z79899 Other long term (current) drug therapy: Secondary | ICD-10-CM | POA: Diagnosis not present

## 2022-04-19 DIAGNOSIS — I272 Pulmonary hypertension, unspecified: Secondary | ICD-10-CM

## 2022-04-19 DIAGNOSIS — I428 Other cardiomyopathies: Secondary | ICD-10-CM | POA: Diagnosis not present

## 2022-04-19 DIAGNOSIS — I25118 Atherosclerotic heart disease of native coronary artery with other forms of angina pectoris: Secondary | ICD-10-CM | POA: Diagnosis not present

## 2022-04-19 MED ORDER — TELMISARTAN 80 MG PO TABS: 80.0000 mg | ORAL_TABLET | Freq: Every day | ORAL | 3 refills | Status: DC

## 2022-04-19 MED ORDER — ATORVASTATIN CALCIUM 20 MG PO TABS: 20.0000 mg | ORAL_TABLET | Freq: Every day | ORAL | 3 refills | Status: DC

## 2022-04-19 NOTE — Progress Notes (Signed)
Cardiology Office Note:    Date:  04/19/2022   ID:  Juan Kent, DOB 1956/01/22, MRN [id number]  PCP:  Emilio Aspen, MD  Cardiologist:  Parke Poisson, MD  Electrophysiologist:  None   Referring MD: Emilio Aspen, *   Chief Complaint/Reason for Referral: HFrEF  History of Present Illness:    Juan Kent is a 67 y.o. male with a history of hypertension, tobacco use, hyperlipidemia, GERD, psoriasis, systolic and diastolic heart failure, CAD, pulmonary hypertension, alcohol use, tobacco use, cirrhosis who presents for follow up.   Works at [employer] on [employer] for many years. Going well but busy and short staffed.   CM felt 2/2 to ETOH and HTN. Nonobstructive CAD on cath.   Today, he presents with concerns of his blood pressure being too high. He believes that it may be related to his breathing. He reports his blood pressure has been around 140s/80s. His heart rate averages at 100-110 bpm.   He currently is not taking Entresto or Jardiance for unreasonable pricing. Placed on telmisartan by PCP, we discussed possibly increasing Telmisartan to 80 mg.  We discussed need to treat his triglyceride value, 250.   He has completely cut out alcohol from his diet. He only drinks water and coffee.   He reports that he has not had issues with swelling and has been wearing compression socks. He has been having dry skin on his legs that becomes very itchy.   Notes myalgias, maybe statin related.  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 He hurt his back and hip about a month ago following a fall off a ladder  He denies any palpitations, chest pain, or peripheral edema. No lightheadedness, headaches, syncope, orthopnea, or PND.  Past Medical History:  Diagnosis Date   Abnormal finding on imaging of liver    Hypertension    Non-ischemic cardiomyopathy (HCC)    Pulmonary hypertension (HCC)    Systolic heart failure (HCC)    Tobacco use disorder      Past Surgical History:  Procedure Laterality Date   RIGHT/LEFT HEART CATH AND CORONARY ANGIOGRAPHY N/A 09/25/2020   Procedure: RIGHT/LEFT HEART CATH AND CORONARY ANGIOGRAPHY;  Surgeon: Lyn Records, MD;  [facility];  Service: Cardiovascular;  Laterality: N/A;    Current Medications: Current Meds  Medication Sig   aspirin 81 MG chewable tablet Chew 1 tablet (81 mg total) by mouth daily.   carvedilol (COREG) 12.5 MG tablet Take 1 tablet (12.5 mg total) by mouth 2 (two) times daily with a meal.   digoxin (LANOXIN) 0.125 MG tablet Take 1 tablet (0.125 mg total) by mouth daily.   furosemide (LASIX) 20 MG tablet Take 1 tablet (20 mg total) by mouth daily.   metFORMIN (GLUCOPHAGE-XR) 750 MG 24 hr tablet Take 750 mg by mouth daily.   spironolactone (ALDACTONE) 25 MG tablet Take 25 mg by mouth daily.   varenicline (CHANTIX) 1 MG tablet Take by mouth.   [DISCONTINUED] atorvastatin (LIPITOR) 40 MG tablet Take 1 tablet (40 mg total) by mouth daily.   [DISCONTINUED] telmisartan (MICARDIS) 40 MG tablet Take 40 mg by mouth daily.     Allergies:   Patient has no known allergies.   Social History   Tobacco Use   Smoking status: Every Day    Types: Cigarettes   Smokeless tobacco: Never  Substance Use Topics   Alcohol use: Yes   Drug use: Never  Family History: The patient's family history includes Cancer in his father.  ROS:   Please see the history of present illness.    (+) Back and hip pain (fall related) All other systems reviewed and are negative.  EKGs/Labs/Other Studies Reviewed:    The following studies were reviewed today:  EKG:  EKG is personally reviewed. 04/19/2022: Sinus rhythm. Rate 84 bpm. Diffuse ST and T wave abnormalities.  12/10/2020: NSR, LAFB, inferior and anteroseptal infarct pattern, ST/T wave abnl lateral.  Imaging studies that I have independently reviewed today:  LHC 09/25/20, nonobstructive CAD  Recent Labs: No results found for  requested labs within last 365 days.  Recent Lipid Panel    Component Value Date/Time   CHOL 205 (H) 12/10/2020 0922   TRIG 313 (H) 12/10/2020 0922   HDL 26 (L) 12/10/2020 0922   CHOLHDL 7.9 (H) 12/10/2020 0922   CHOLHDL 10.2 09/21/2020 1855   VLDL 23 09/21/2020 1855   LDLCALC 123 (H) 12/10/2020 0922    Physical Exam:    VS:  BP (!) 170/100   Pulse 84   Ht 5\' 4"  (1.626 m)   Wt 179 lb 3.2 oz (81.3 kg)   SpO2 97%   BMI 30.76 kg/m     Wt Readings from Last 5 Encounters:  04/19/22 179 lb 3.2 oz (81.3 kg)  12/10/20 169 lb (76.7 kg)  10/30/20 165 lb (74.8 kg)  10/09/20 162 lb 4.8 oz (73.6 kg)  10/06/20 161 lb 12.8 oz (73.4 kg)    Constitutional: No acute distress Eyes: sclera non-icteric, normal conjunctiva and lids ENMT: normal dentition, moist mucous membranes Cardiovascular: regular rhythm, normal rate, no murmurs. S1 and S2 normal. No jugular venous distention.  Respiratory: clear to auscultation bilaterally GI : normal bowel sounds, soft and nontender. No distention.   MSK: extremities warm, well perfused. No edema.  NEURO: grossly nonfocal exam, moves all extremities. PSYCH: alert and oriented x 3, normal mood and affect.   ASSESSMENT:    1. Chronic systolic CHF (congestive heart failure) ([place])   2. Nonischemic cardiomyopathy ([place])   3. Therapeutic drug monitoring   4. Coronary artery disease of native artery of native heart with stable angina pectoris ([place])   5. Hyperlipidemia LDL goal <70   6. Pulmonary hypertension ([place])   7. Essential hypertension     PLAN:    Chronic systolic CHF (congestive heart failure) (HCC) -  Nonischemic cardiomyopathy ([place]) Drug monitoring HTN Medication management-   Heart Failure Therapy ACE-I/ARB/ARNI: had stopped entresto due to price. Now on telmisartan, increase to 80 mg daily. BB: carvedilol 12.5 mg BID MRA: spironolactone 25 mg daily  SGLT2I: does not take anymore, due to cost.  Diuretic plan: lasix 20 mg daily.  -  need repeat echo, and AHF appt.  - class II symptoms primarily. I am concerned about undertreated HF, will establish with HF clinic to get assistance with medications, and closely evaluate his NICM. Overall functionally well but does get DOE and fatigue. - needs digoxin level for monitoring, continue digoxin 0.125 mg daily for now.   Coronary artery disease of native artery of native heart with stable angina pectoris (HCC) - nonobstructive, myalgias with statin, will try to decrease to 20 mg daily atorvastatin.   Hyperlipidemia LDL goal <70 - Plan: Lipid panel - LDL near goal but trigs elevated, likely will need targeted therapy. Will see how the does with the reduced dose of atorvastatin first and increase in telmisartan, would like to add a fibrate or  vascepa soon.   Pulmonary hypertension ([place]) - likely secondary to left heart disease, continue medical therapy, repeat echo.    Total time of encounter: 30 minutes total time of encounter, including 20 minutes spent in face-to-face patient care on the date of this encounter. This time includes coordination of care and counseling regarding above mentioned problem list. Remainder of non-face-to-face time involved reviewing chart documents/testing relevant to the patient encounter and documentation in the medical record. I have independently reviewed documentation from referring provider.   Cherlynn Kaiser, MD, [place]   Shared Decision Making/Informed Consent:       Medication Adjustments/Labs and Tests Ordered: Current medicines are reviewed at length with the patient today.  Concerns regarding medicines are outlined above.   Orders Placed This Encounter  Procedures   Digoxin level   AMB referral to CHF clinic   EKG 12-Lead   ECHOCARDIOGRAM COMPLETE    Meds ordered this encounter  Medications   telmisartan (MICARDIS) 80 MG tablet    Sig: Take 1 tablet (80 mg total) by mouth daily.    Dispense:  90 tablet     Refill:  3   atorvastatin (LIPITOR) 20 MG tablet    Sig: Take 1 tablet (20 mg total) by mouth daily.    Dispense:  90 tablet    Refill:  3    Patient Instructions  Medication Instructions:  DECREASE atorvastatin to 20mg  daily INCREASE telmisartan to 80mg  daily CONTINUE all other current medications   *If you need a refill on your cardiac medications before your next appointment, please call your pharmacy*   Lab Work: Digoxin Level - either at Dr. Delphina Cahill office or primary care  If you have labs (blood work) drawn today and your tests are completely normal, you will receive your results only by: [place] (if you have MyChart) OR A paper copy in the mail If you have any lab test that is abnormal or we need to change your treatment, we will call you to review the results.   Testing/Procedures: Echocardiogram - please complete at [facility] ([address]) OR [facility]     Follow-Up: At [facility], you and your health needs are our priority.  As part of our continuing mission to provide you with exceptional heart care, we have created designated Provider Care Teams.  These Care Teams include your primary Cardiologist (physician) and Advanced Practice Providers (APPs -  Physician Assistants and Nurse Practitioners) who all work together to provide you with the care you need, when you need it.  We recommend signing up for the patient portal called "MyChart".  Sign up information is provided on this After Visit Summary.  MyChart is used to connect with patients for Virtual Visits (Telemedicine).  Patients are able to view lab/test results, encounter notes, upcoming appointments, etc.  Non-urgent messages can be sent to your provider as well.   To learn more about what you can do with MyChart, go to [email or web address].    Your next appointment:   6 month(s)  Provider:   Elouise Munroe, MD    Other  Instructions You have been referred to [facility]. This clinic is located on the campus of [facility].   Phone: [phone]      I,Rachel Rivera,acting as a scribe for Elouise Munroe, MD.,have documented all relevant documentation on the behalf of Elouise Munroe, MD,as directed by  Elouise Munroe,  MD while in the presence of Elouise Munroe, MD.  I, Elouise Munroe, MD, have reviewed all documentation for the visit on 04/19/2022. The documentation on today's date of service for the exam, diagnosis, procedures, and orders are all accurate and complete.

## 2022-04-19 NOTE — Patient Instructions (Addendum)
Medication Instructions:  DECREASE atorvastatin to 20mg  daily INCREASE telmisartan to 80mg  daily CONTINUE all other current medications   *If you need a refill on your cardiac medications before your next appointment, please call your pharmacy*   Lab Work: Digoxin Level - either at Dr. Delphina Cahill office or primary care  If you have labs (blood work) drawn today and your tests are completely normal, you will receive your results only by: [place] (if you have MyChart) OR A paper copy in the mail If you have any lab test that is abnormal or we need to change your treatment, we will call you to review the results.   Testing/Procedures: Echocardiogram - please complete at [facility] ([address]) OR [facility]     Follow-Up: At [facility], you and your health needs are our priority.  As part of our continuing mission to provide you with exceptional heart care, we have created designated Provider Care Teams.  These Care Teams include your primary Cardiologist (physician) and Advanced Practice Providers (APPs -  Physician Assistants and Nurse Practitioners) who all work together to provide you with the care you need, when you need it.  We recommend signing up for the patient portal called "MyChart".  Sign up information is provided on this After Visit Summary.  MyChart is used to connect with patients for Virtual Visits (Telemedicine).  Patients are able to view lab/test results, encounter notes, upcoming appointments, etc.  Non-urgent messages can be sent to your provider as well.   To learn more about what you can do with MyChart, go to [email or web address].    Your next appointment:   6 month(s)  Provider:   Elouise Munroe, MD    Other Instructions You have been referred to [facility]. This clinic is located on the campus of [facility].   Phone: [phone]

## 2022-04-21 DIAGNOSIS — E7800 Pure hypercholesterolemia, unspecified: Secondary | ICD-10-CM | POA: Diagnosis not present

## 2022-04-21 DIAGNOSIS — I10 Essential (primary) hypertension: Secondary | ICD-10-CM | POA: Diagnosis not present

## 2022-04-21 DIAGNOSIS — K219 Gastro-esophageal reflux disease without esophagitis: Secondary | ICD-10-CM | POA: Diagnosis not present

## 2022-04-21 DIAGNOSIS — J449 Chronic obstructive pulmonary disease, unspecified: Secondary | ICD-10-CM | POA: Diagnosis not present

## 2022-04-21 DIAGNOSIS — I5042 Chronic combined systolic (congestive) and diastolic (congestive) heart failure: Secondary | ICD-10-CM | POA: Diagnosis not present

## 2022-04-21 DIAGNOSIS — I2510 Atherosclerotic heart disease of native coronary artery without angina pectoris: Secondary | ICD-10-CM | POA: Diagnosis not present

## 2022-05-10 DIAGNOSIS — I428 Other cardiomyopathies: Secondary | ICD-10-CM | POA: Diagnosis not present

## 2022-05-10 DIAGNOSIS — E7800 Pure hypercholesterolemia, unspecified: Secondary | ICD-10-CM | POA: Diagnosis not present

## 2022-05-10 DIAGNOSIS — Z5181 Encounter for therapeutic drug level monitoring: Secondary | ICD-10-CM | POA: Diagnosis not present

## 2022-05-11 ENCOUNTER — Ambulatory Visit (HOSPITAL_COMMUNITY): Payer: PPO | Attending: Cardiovascular Disease

## 2022-05-11 DIAGNOSIS — I5022 Chronic systolic (congestive) heart failure: Secondary | ICD-10-CM

## 2022-05-11 LAB — ECHOCARDIOGRAM COMPLETE
Area-P 1/2: 3.91 cm2
S' Lateral: 3 cm

## 2022-05-24 ENCOUNTER — Encounter (HOSPITAL_COMMUNITY): Payer: PPO | Admitting: Cardiology

## 2022-05-25 ENCOUNTER — Other Ambulatory Visit: Payer: Self-pay | Admitting: Internal Medicine

## 2022-05-25 DIAGNOSIS — I739 Peripheral vascular disease, unspecified: Secondary | ICD-10-CM | POA: Diagnosis not present

## 2022-05-25 DIAGNOSIS — I5042 Chronic combined systolic (congestive) and diastolic (congestive) heart failure: Secondary | ICD-10-CM | POA: Diagnosis not present

## 2022-05-25 DIAGNOSIS — I700 Atherosclerosis of aorta: Secondary | ICD-10-CM | POA: Diagnosis not present

## 2022-05-25 DIAGNOSIS — R7303 Prediabetes: Secondary | ICD-10-CM | POA: Diagnosis not present

## 2022-05-25 DIAGNOSIS — K7460 Unspecified cirrhosis of liver: Secondary | ICD-10-CM | POA: Diagnosis not present

## 2022-05-25 DIAGNOSIS — I714 Abdominal aortic aneurysm, without rupture, unspecified: Secondary | ICD-10-CM | POA: Diagnosis not present

## 2022-05-25 DIAGNOSIS — I110 Hypertensive heart disease with heart failure: Secondary | ICD-10-CM | POA: Diagnosis not present

## 2022-05-25 DIAGNOSIS — R972 Elevated prostate specific antigen [PSA]: Secondary | ICD-10-CM | POA: Diagnosis not present

## 2022-05-25 DIAGNOSIS — J432 Centrilobular emphysema: Secondary | ICD-10-CM | POA: Diagnosis not present

## 2022-05-25 DIAGNOSIS — F1021 Alcohol dependence, in remission: Secondary | ICD-10-CM | POA: Diagnosis not present

## 2022-05-25 DIAGNOSIS — I723 Aneurysm of iliac artery: Secondary | ICD-10-CM | POA: Diagnosis not present

## 2022-05-25 DIAGNOSIS — I10 Essential (primary) hypertension: Secondary | ICD-10-CM | POA: Diagnosis not present

## 2022-05-25 DIAGNOSIS — J449 Chronic obstructive pulmonary disease, unspecified: Secondary | ICD-10-CM | POA: Diagnosis not present

## 2022-06-06 ENCOUNTER — Other Ambulatory Visit: Payer: Self-pay | Admitting: *Deleted

## 2022-06-06 DIAGNOSIS — I714 Abdominal aortic aneurysm, without rupture, unspecified: Secondary | ICD-10-CM

## 2022-06-14 ENCOUNTER — Other Ambulatory Visit: Payer: PPO

## 2022-06-20 ENCOUNTER — Ambulatory Visit (HOSPITAL_COMMUNITY): Payer: PPO

## 2022-06-20 ENCOUNTER — Encounter: Payer: PPO | Admitting: Surgery

## 2022-07-08 NOTE — Telephone Encounter (Signed)
Encounter open in error 

## 2022-07-19 ENCOUNTER — Ambulatory Visit (INDEPENDENT_AMBULATORY_CARE_PROVIDER_SITE_OTHER): Payer: PPO | Admitting: Vascular Surgery

## 2022-07-19 ENCOUNTER — Ambulatory Visit (HOSPITAL_COMMUNITY)
Admission: RE | Admit: 2022-07-19 | Discharge: 2022-07-19 | Disposition: A | Discharge status: Home / Self Care | Payer: PPO | Source: Ambulatory Visit | Attending: Surgery | Admitting: Surgery

## 2022-07-19 ENCOUNTER — Encounter: Payer: Self-pay | Admitting: Vascular Surgery

## 2022-07-19 VITALS — BP 117/73 | HR 70 | Temp 98.5°F | Resp 20 | Ht 64.0 in | Wt 174.0 lb

## 2022-07-19 DIAGNOSIS — I7409 Other arterial embolism and thrombosis of abdominal aorta: Secondary | ICD-10-CM | POA: Diagnosis not present

## 2022-07-19 DIAGNOSIS — I714 Abdominal aortic aneurysm, without rupture, unspecified: Secondary | ICD-10-CM | POA: Diagnosis not present

## 2022-07-19 DIAGNOSIS — I7143 Infrarenal abdominal aortic aneurysm, without rupture: Secondary | ICD-10-CM

## 2022-07-19 NOTE — Progress Notes (Signed)
[facility] [place]  ASSESSMENT / PLAN: 67 y.o. male abdominal aortic aneurysm measuring 35mm; aortoiliac occlusive disease causing claudication.  Recommend:  Complete cessation from all tobacco products. Blood glucose control with goal A1c < 7%. Blood pressure control with goal blood pressure < 140/90 mmHg. Lipid reduction therapy with goal LDL-C <100 mg/dL (<16 if symptomatic from PAD).  Aspirin  PO QD.  Atorvastatin 40-80mg  PO QD (or other "high intensity" statin therapy).  Will see him again in 1 year with abdominal aortic aneurysm duplex, carotid duplex, ankle-brachial index.  CHIEF COMPLAINT: Cramping pain with walking; abdominal aortic aneurysm  HISTORY OF PRESENT ILLNESS: Juan Kent is a 67 y.o. male clinic for evaluation of abdominal aortic aneurysm.  The patient also endorses claudication.  He has a fairly extensive past medical history given his young age.  He has ongoing tobacco abuse.  He reports cramping discomfort in his left calf fairly classic for claudication which begins after several blocks.  He denies rest pain or ulceration about his feet.  He has a known abdominal aortic aneurysm.  He has no symptoms referable to his abdominal aortic aneurysm.   Past Medical History:  Diagnosis Date   Abnormal finding on imaging of liver    Hypertension    Non-ischemic cardiomyopathy    Pulmonary hypertension    Systolic heart failure    Tobacco use disorder     Past Surgical History:  Procedure Laterality Date   RIGHT/LEFT HEART CATH AND CORONARY ANGIOGRAPHY N/A 09/25/2020   Procedure: RIGHT/LEFT HEART CATH AND CORONARY ANGIOGRAPHY;  Surgeon: Lyn Records, MD;  Location: [facility];  Service: Cardiovascular;  Laterality: N/A;    Family History  Problem Relation Age of Onset   Cancer Father     Social History   Socioeconomic History   Marital status: Widowed    Spouse name: Not on file   Number of children: Not on file   Years  of education: Not on file   Highest education level: Not on file  Occupational History   Not on file  Tobacco Use   Smoking status: Every Day    Packs/day: 1    Types: Cigarettes   Smokeless tobacco: Never  Substance and Sexual Activity   Alcohol use: Yes   Drug use: Never   Sexual activity: Not on file  Other Topics Concern   Not on file  Social History Narrative   Not on file   Social Determinants of Health   Financial Resource Strain: High Risk (09/24/2020)   Overall Financial Resource Strain (CARDIA)    Difficulty of Paying Living Expenses: Hard  Food Insecurity: Food Insecurity Present (09/24/2020)   Hunger Vital Sign    Worried About Running Out of Food in the Last Year: Sometimes true    Ran Out of Food in the Last Year: Sometimes true  Transportation Needs: No Transportation Needs (09/24/2020)   PRAPARE - Administrator, Civil Service (Medical): No    Lack of Transportation (Non-Medical): No  Physical Activity: Not on file  Stress: Not on file  Social Connections: Not on file  Intimate Partner Violence: Not on file    No Known Allergies  Current Outpatient Medications  Medication Sig Dispense Refill   aspirin 81 MG chewable tablet Chew 1 tablet (81 mg total) by mouth daily. 30 tablet 0   atorvastatin (LIPITOR) 20 MG tablet Take 1 tablet (20 mg total) by mouth daily. 90 tablet 3   carvedilol (  COREG) 25 MG tablet Take 25 mg by mouth 2 (two) times daily.     digoxin (LANOXIN) 0.125 MG tablet Take 1 tablet (0.125 mg total) by mouth daily. 30 tablet 5   furosemide (LASIX) 20 MG tablet Take 1 tablet (20 mg total) by mouth daily. 30 tablet 5   JARDIANCE 10 MG TABS tablet Take 10 mg by mouth daily.     metFORMIN (GLUCOPHAGE-XR) 750 MG 24 hr tablet Take 750 mg by mouth daily.     spironolactone (ALDACTONE) 25 MG tablet Take 25 mg by mouth daily.     telmisartan (MICARDIS) 80 MG tablet Take 1 tablet (80 mg total) by mouth daily. 90 tablet 3   varenicline  (CHANTIX) 1 MG tablet Take by mouth.     No current facility-administered medications for this visit.    PHYSICAL EXAM Vitals:   07/19/22 0948 07/19/22 0950  BP: 121/76 117/73  Pulse: 70   Resp: 20   Temp: 98.5 F (36.9 C)   SpO2: 96%   Weight: 174 lb (78.9 kg)   Height: 5\' 4"  (1.626 m)    Chronically ill gentleman in no acute distress Regular rate and rhythm Unlabored breathing Protuberant abdomen No palpable pedal pulses; feet are warm  PERTINENT LABORATORY AND RADIOLOGIC DATA  Most recent CBC    Latest Ref Rng & Units 09/25/2020    8:10 AM 09/25/2020    8:09 AM 09/25/2020    8:05 AM  CBC  Hemoglobin 13.0 - 17.0 g/dL 56.8  61.6  83.7   Hematocrit 39.0 - 52.0 % 47.0  47.0  46.0      Most recent CMP    Latest Ref Rng & Units 12/10/2020    9:22 AM 10/13/2020    9:49 AM 10/06/2020    1:15 PM  CMP  Glucose 65 - 99 mg/dL 290  211  155   BUN 8 - 27 mg/dL 15  17  28    Creatinine 0.76 - 1.27 mg/dL 2.08  0.22  3.36   Sodium 134 - 144 mmol/L 142  139  135   Potassium 3.5 - 5.2 mmol/L 4.5  3.9  5.2   Chloride 96 - 106 mmol/L 103  106  101   CO2 20 - 29 mmol/L 22  23  25    Calcium 8.6 - 10.2 mg/dL 9.6  9.6  9.9     Renal function CrCl cannot be calculated (Patient's most recent lab result is older than the maximum 21 days allowed.).  Hgb A1c MFr Bld (%)  Date Value  09/21/2020 6.7 (H)    LDL Chol Calc (NIH)  Date Value Ref Range Status  12/10/2020 123 (H) 0 - 99 mg/dL Final     Vascular Imaging: Abdominal Aorta: There is evidence of abnormal dilatation of the mid and  distal Abdominal aorta. There is evidence of abnormal dilation of the  Right Common Iliac artery and Left Common Iliac artery. The largest aortic  diameter remains essentially  unchanged compared to prior exam. Previous diameter measurement was 3.5 cm  obtained on 09/30/21 by CTA.   Rande Brunt. Lenell Antu, MD FACS [facility] Phone Number: [phone] 07/19/2022  5:00 PM   Total time spent on preparing this encounter including chart review, data review, collecting history, examining the patient, coordinating care for this new patient, 60 minutes.  Portions of this report may have been transcribed using voice recognition software.  Every effort has been made to ensure accuracy;  however, inadvertent computerized transcription errors may still be present.

## 2022-07-27 ENCOUNTER — Other Ambulatory Visit: Payer: Self-pay

## 2022-07-27 DIAGNOSIS — I7143 Infrarenal abdominal aortic aneurysm, without rupture: Secondary | ICD-10-CM

## 2022-07-27 DIAGNOSIS — E785 Hyperlipidemia, unspecified: Secondary | ICD-10-CM

## 2022-07-27 DIAGNOSIS — I7409 Other arterial embolism and thrombosis of abdominal aorta: Secondary | ICD-10-CM

## 2023-01-24 ENCOUNTER — Ambulatory Visit (HOSPITAL_COMMUNITY): Payer: PPO

## 2023-01-24 ENCOUNTER — Ambulatory Visit: Payer: PPO | Admitting: Vascular Surgery

## 2023-02-25 ENCOUNTER — Other Ambulatory Visit: Payer: Self-pay | Admitting: Internal Medicine

## 2023-05-04 IMAGING — CT CT CHEST LUNG CANCER SCREENING LOW DOSE W/O CM
1 series · 15 of 32 positions shown, 19 images · non-contrast
Comparison: None Available.

CLINICAL DATA: 66-year-old asymptomatic male current smoker with 45
pack-year smoking history.



[Series 3: ldct screening <30 bmi · axial · 0.74mm/px · z∈[+985,+1295]mm · 15 of 71 slices shown, 19 images]
[im 6/71  mediastinal]
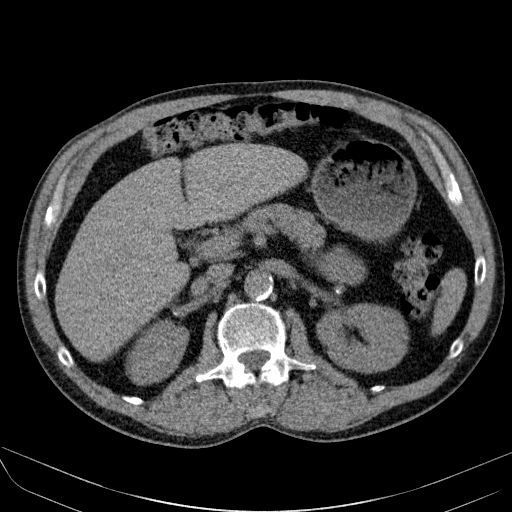
[im 6/71  lung]
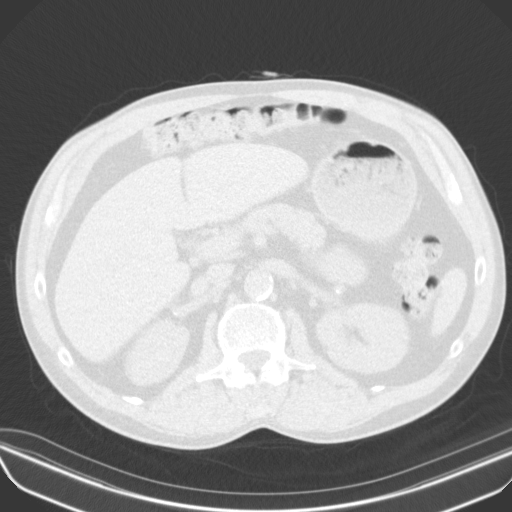
[im 11/71  lung]
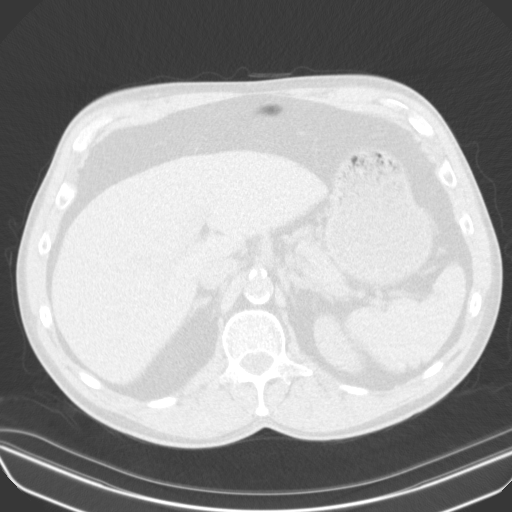
[im 15/71  lung]
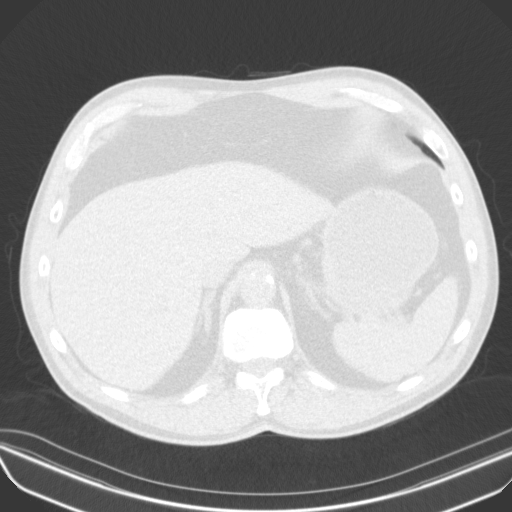
[im 19/71  lung]
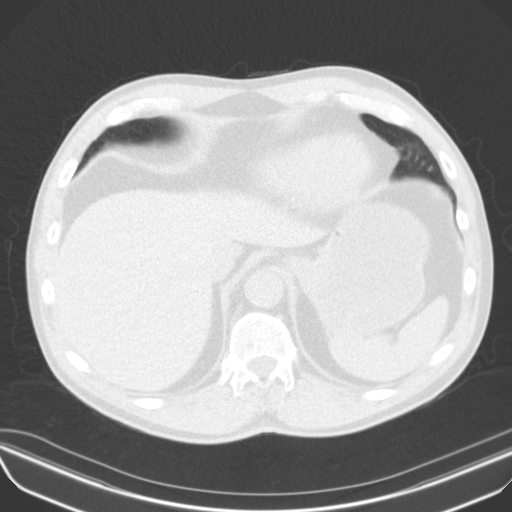
[im 24/71  mediastinal]
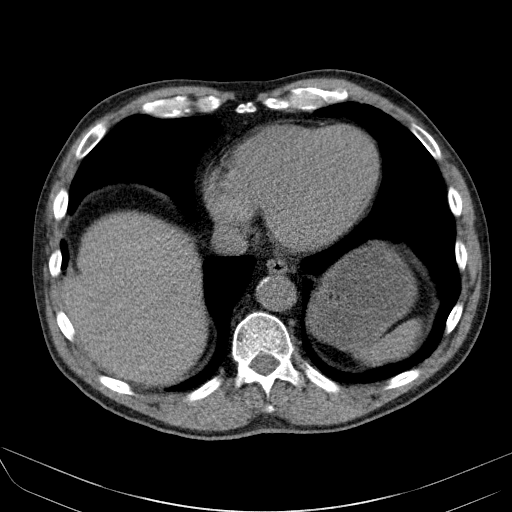
[im 24/71  lung]
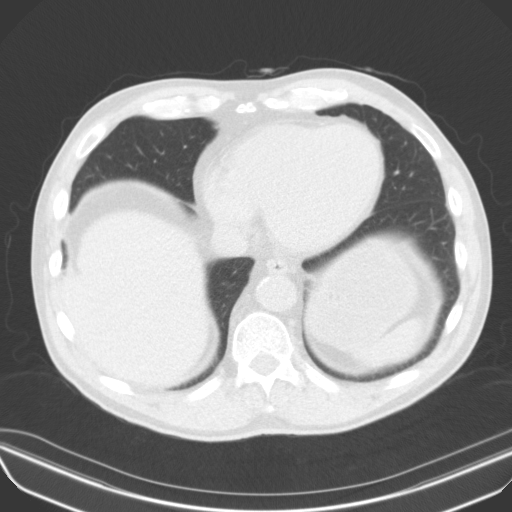
[im 29/71  lung]
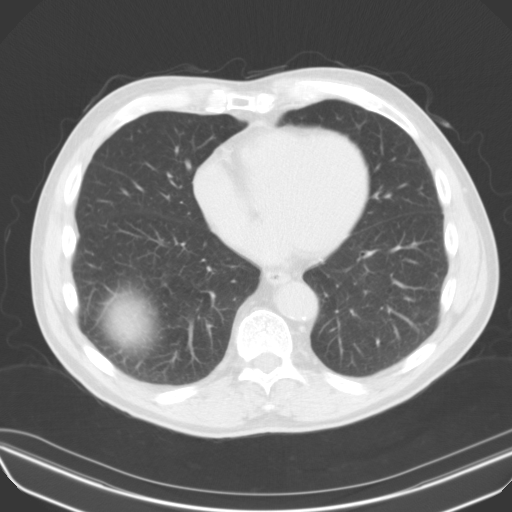
[im 34/71  lung]
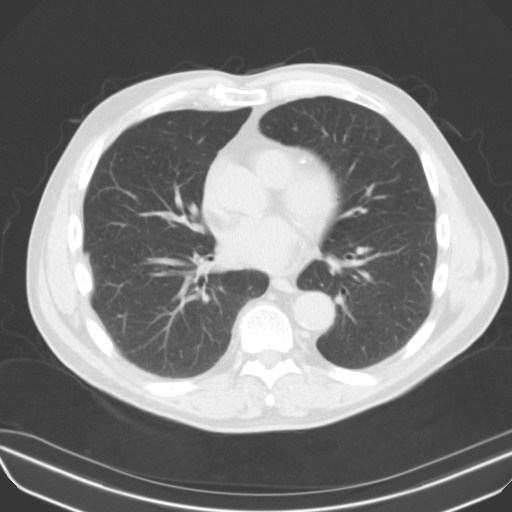
[im 38/71  lung]
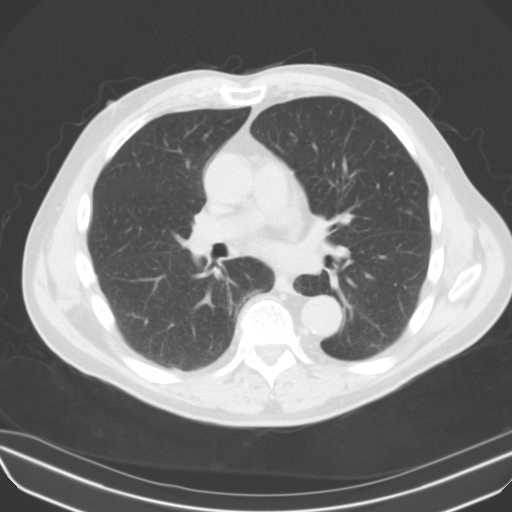
[im 42/71  mediastinal]
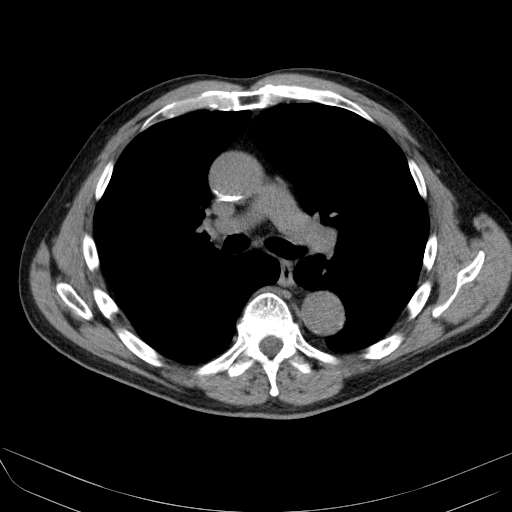
[im 42/71  lung]
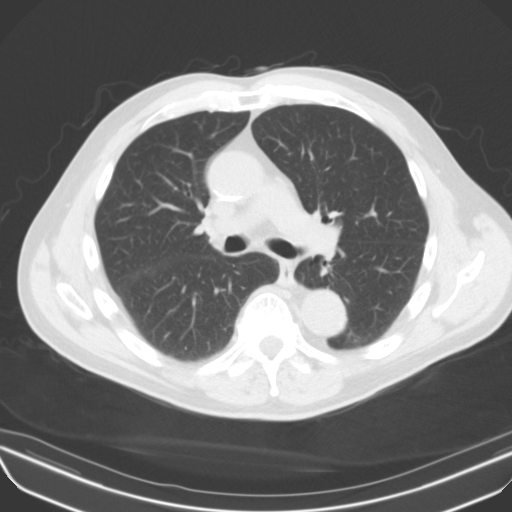
[im 45/71  lung]
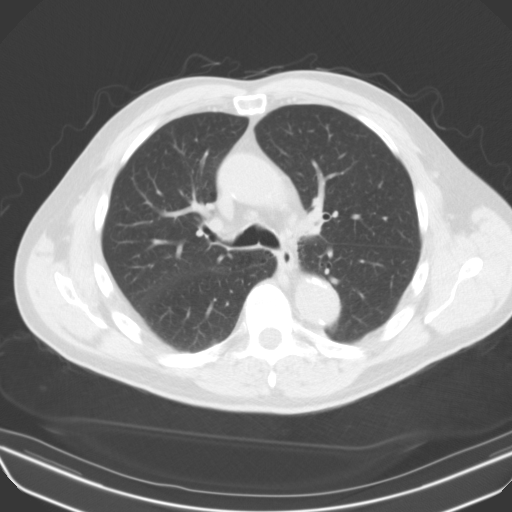
[im 50/71  lung]
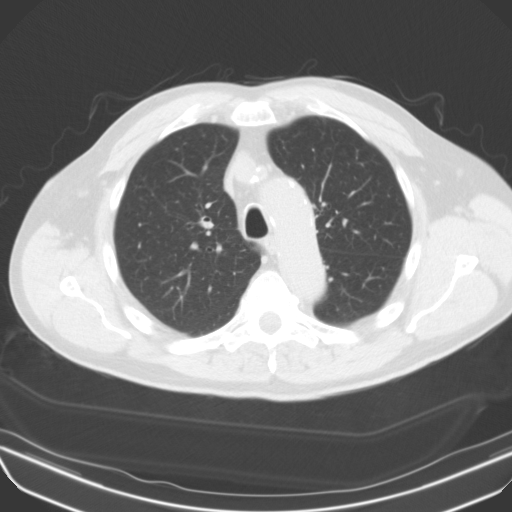
[im 55/71  lung]
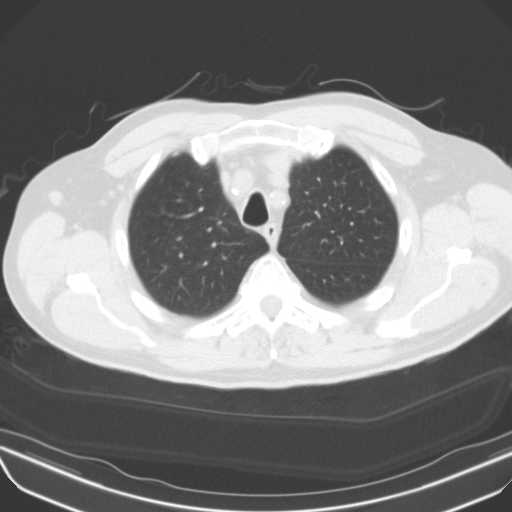
[im 58/71  mediastinal]
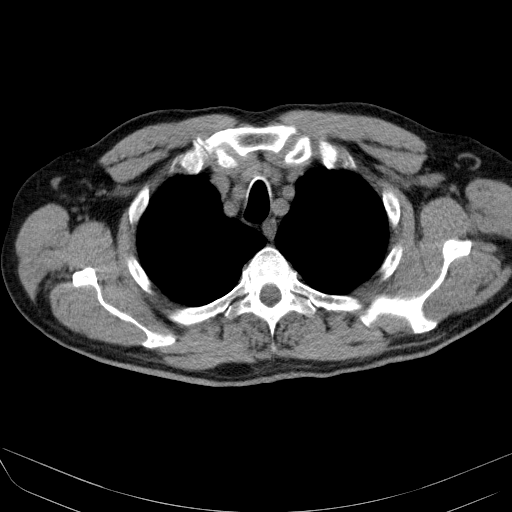
[im 58/71  lung]
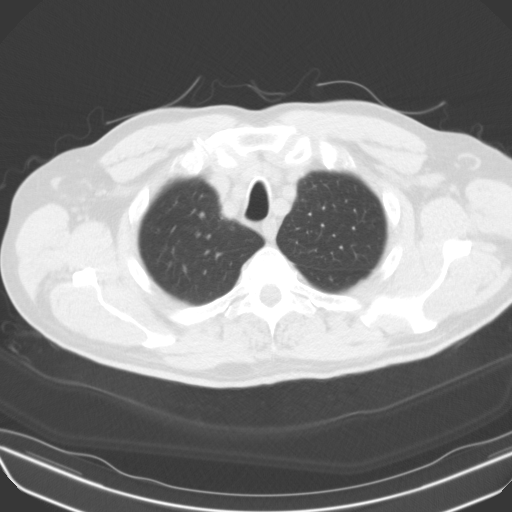
[im 63/71  lung]
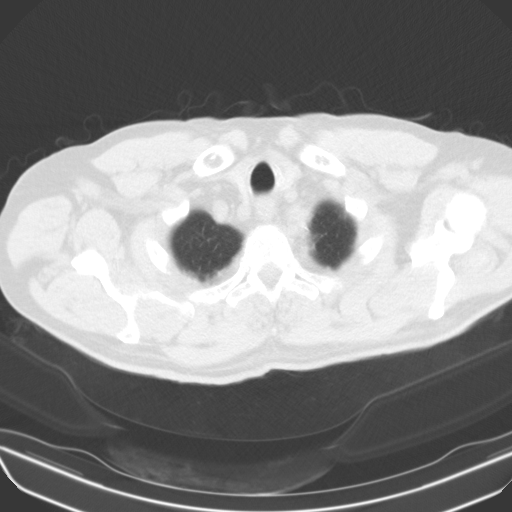
[im 68/71  lung]
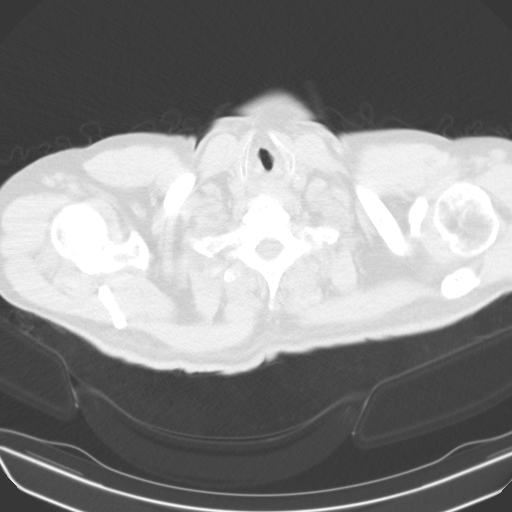

[15 of 32 positions shown; findings below may reference images not displayed]

FINDINGS: Cardiovascular: Normal heart size. No significant pericardial
effusion/thickening. Three-vessel coronary atherosclerosis.
Atherosclerotic nonaneurysmal thoracic aorta. Normal caliber
pulmonary arteries.

Mediastinum/Nodes: No discrete thyroid nodules. Unremarkable
esophagus. No pathologically enlarged axillary, mediastinal or hilar
lymph nodes, noting limited sensitivity for the detection of hilar
adenopathy on this noncontrast study.

Lungs/Pleura: No pneumothorax. No pleural effusion. Mild
centrilobular emphysema with diffuse bronchial wall thickening. No
acute consolidative airspace disease or lung masses. Tiny solid
anterior apical right upper lobe pulmonary nodule measuring 2.3 mm
in volume derived mean diameter (series 4/image 76). No additional
significant pulmonary nodules.

Upper abdomen: No acute abnormality.

Musculoskeletal: No aggressive appearing focal osseous lesions. Mild
thoracic spondylosis.
IMPRESSION: 1. Lung-RADS 2, benign appearance or behavior. Continue annual
screening with low-dose chest CT without contrast in 12 months.
2. Three-vessel coronary atherosclerosis.
3. Aortic Atherosclerosis (VVTCT-YWX.X) and Emphysema (VVTCT-8NB.C).

## 2023-07-24 ENCOUNTER — Ambulatory Visit: Admitting: Student in an Organized Health Care Education/Training Program

## 2023-07-27 ENCOUNTER — Other Ambulatory Visit

## 2023-07-27 ENCOUNTER — Ambulatory Visit (INDEPENDENT_AMBULATORY_CARE_PROVIDER_SITE_OTHER): Payer: Self-pay | Admitting: Student in an Organized Health Care Education/Training Program

## 2023-07-27 ENCOUNTER — Encounter: Payer: Self-pay | Admitting: Student in an Organized Health Care Education/Training Program

## 2023-07-27 VITALS — BP 142/82 | HR 72 | Wt 159.0 lb

## 2023-07-27 DIAGNOSIS — E7800 Pure hypercholesterolemia, unspecified: Secondary | ICD-10-CM | POA: Diagnosis not present

## 2023-07-27 DIAGNOSIS — J432 Centrilobular emphysema: Secondary | ICD-10-CM | POA: Insufficient documentation

## 2023-07-27 DIAGNOSIS — F1021 Alcohol dependence, in remission: Secondary | ICD-10-CM | POA: Insufficient documentation

## 2023-07-27 DIAGNOSIS — F411 Generalized anxiety disorder: Secondary | ICD-10-CM | POA: Insufficient documentation

## 2023-07-27 DIAGNOSIS — I5022 Chronic systolic (congestive) heart failure: Secondary | ICD-10-CM

## 2023-07-27 DIAGNOSIS — F17200 Nicotine dependence, unspecified, uncomplicated: Secondary | ICD-10-CM | POA: Insufficient documentation

## 2023-07-27 DIAGNOSIS — R7303 Prediabetes: Secondary | ICD-10-CM | POA: Diagnosis not present

## 2023-07-27 DIAGNOSIS — J449 Chronic obstructive pulmonary disease, unspecified: Secondary | ICD-10-CM | POA: Insufficient documentation

## 2023-07-27 DIAGNOSIS — R972 Elevated prostate specific antigen [PSA]: Secondary | ICD-10-CM | POA: Insufficient documentation

## 2023-07-27 DIAGNOSIS — I723 Aneurysm of iliac artery: Secondary | ICD-10-CM | POA: Insufficient documentation

## 2023-07-27 DIAGNOSIS — R911 Solitary pulmonary nodule: Secondary | ICD-10-CM | POA: Insufficient documentation

## 2023-07-27 DIAGNOSIS — K219 Gastro-esophageal reflux disease without esophagitis: Secondary | ICD-10-CM | POA: Insufficient documentation

## 2023-07-27 DIAGNOSIS — I25118 Atherosclerotic heart disease of native coronary artery with other forms of angina pectoris: Secondary | ICD-10-CM

## 2023-07-27 DIAGNOSIS — K4090 Unilateral inguinal hernia, without obstruction or gangrene, not specified as recurrent: Secondary | ICD-10-CM | POA: Insufficient documentation

## 2023-07-27 DIAGNOSIS — J209 Acute bronchitis, unspecified: Secondary | ICD-10-CM | POA: Insufficient documentation

## 2023-07-27 DIAGNOSIS — I714 Abdominal aortic aneurysm, without rupture, unspecified: Secondary | ICD-10-CM | POA: Insufficient documentation

## 2023-07-27 DIAGNOSIS — I10 Essential (primary) hypertension: Secondary | ICD-10-CM

## 2023-07-27 DIAGNOSIS — I7143 Infrarenal abdominal aortic aneurysm, without rupture: Secondary | ICD-10-CM

## 2023-07-27 DIAGNOSIS — I739 Peripheral vascular disease, unspecified: Secondary | ICD-10-CM | POA: Insufficient documentation

## 2023-07-27 DIAGNOSIS — K5790 Diverticulosis of intestine, part unspecified, without perforation or abscess without bleeding: Secondary | ICD-10-CM | POA: Insufficient documentation

## 2023-07-27 LAB — BASIC METABOLIC PANEL WITH GFR
BUN: 23 mg/dL (ref 6–23)
CO2: 25 meq/L (ref 19–32)
Calcium: 9.9 mg/dL (ref 8.4–10.5)
Chloride: 102 meq/L (ref 96–112)
Creatinine, Ser: 0.98 mg/dL (ref 0.40–1.50)
GFR: 79.98 mL/min (ref >60.00)
Glucose, Bld: 98 mg/dL (ref 70–99)
Potassium: 4.4 meq/L (ref 3.5–5.1)
Sodium: 135 meq/L (ref 135–145)

## 2023-07-27 LAB — LIPID PANEL
Cholesterol: 177 mg/dL (ref 0–200)
HDL: 32.3 mg/dL — ABNORMAL LOW (ref >39.00)
LDL Cholesterol: 115 mg/dL — ABNORMAL HIGH (ref 0–99)
NonHDL: 144.35
Total CHOL/HDL Ratio: 5
Triglycerides: 147 mg/dL (ref 0.0–149.0)
VLDL: 29.4 mg/dL (ref 0.0–40.0)

## 2023-07-27 LAB — HEMOGLOBIN A1C: Hgb A1c MFr Bld: 6.1 % (ref 4.6–6.5)

## 2023-07-27 MED ORDER — TELMISARTAN 80 MG PO TABS: 80.0000 mg | ORAL_TABLET | Freq: Every day | ORAL | 3 refills | Status: DC

## 2023-07-27 MED ORDER — FUROSEMIDE 20 MG PO TABS: 20.0000 mg | ORAL_TABLET | Freq: Every day | ORAL | 5 refills | Status: DC

## 2023-07-27 MED ORDER — CARVEDILOL 25 MG PO TABS: 25.0000 mg | ORAL_TABLET | Freq: Two times a day (BID) | ORAL | 3 refills | Status: AC

## 2023-07-27 MED ORDER — ATORVASTATIN CALCIUM 20 MG PO TABS: 20.0000 mg | ORAL_TABLET | Freq: Every day | ORAL | 3 refills | Status: DC

## 2023-07-27 MED ORDER — SPIRONOLACTONE 25 MG PO TABS: 25.0000 mg | ORAL_TABLET | Freq: Every day | ORAL | 3 refills | Status: AC

## 2023-07-27 MED ORDER — ESCITALOPRAM OXALATE 10 MG PO TABS: 10.0000 mg | ORAL_TABLET | Freq: Every day | ORAL | 1 refills | Status: DC

## 2023-07-27 NOTE — Assessment & Plan Note (Signed)
 Chronic and stable issue.  Has tried Chantix in the past but had some bad side effects.

## 2023-07-27 NOTE — Assessment & Plan Note (Signed)
 Chronic and stable.  Moderate symptom burden of dyspnea with exertion.  Starting to become functional limiting for him.  He had pulmonary function testing in 2023 which did not show fixed obstructive disease.  He does have centrilobular emphysema on imaging.  Will treat with Symbicort, hopeful this will improve some of his exertional capacity.

## 2023-07-27 NOTE — Assessment & Plan Note (Signed)
 3.5 cm infrarenal abdominal aortic aneurysm, asymptomatic.  Next due for imaging surveillance in June 2025.

## 2023-07-27 NOTE — Assessment & Plan Note (Signed)
 Chronic untreated problem.  Strong family history of anxiety disorders.  Recently made worse by losing his job, so I think he has a component of adjustment disorder.  Mostly impacting his sleep quality.  We talked about goals of medication management, set reasonable expectations, discussed side effects of SSRIs.  Plan to start Lexapro  10 mg daily.

## 2023-07-27 NOTE — Progress Notes (Signed)
 New Patient Office Visit  Subjective    Patient ID: Juan Kent, male    DOB: Aug 17, 1955  Age: 68 y.o. MRN: [id number]  CC:  Chief Complaint  Patient presents with   Establish Care    SOB and would like to have bp checked due to needing cataract sugery on may 7th. [facility] is wanting patient to have BP checked for surgery.     HPI  Juan Kent presents to establish care  68 year old man here today for management of hypertension.  He has a history of heart failure with reduced ejection fraction.  He is here today because he is working to get cataract replacements in a couple weeks.  He was asked the eye doctor earlier today and had severe asymptomatic hypertension.  He told him to come to our office for treatment of his hypertension so they can safely do the procedure in a couple weeks.  He reports being out of the telmisartan  medication for about 1 month.  No chest pain or shortness of breath.  He has been having a difficult few months, he lost his job working at the [employer] on [employer], he is a Teaching laboratory technician.  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  He lives in [place] with his mother.  He is feeling a lot of stress because of finances.   Outpatient Encounter Medications as of 07/27/2023  Medication Sig   digoxin  (LANOXIN ) 0.125 MG tablet Take 1 tablet (0.125 mg total) by mouth daily.   escitalopram  (LEXAPRO ) 10 MG tablet Take 1 tablet (10 mg total) by mouth daily.   [DISCONTINUED] atorvastatin  (LIPITOR) 20 MG tablet Take 1 tablet (20 mg total) by mouth daily.   [DISCONTINUED] carvedilol  (COREG ) 25 MG tablet Take 25 mg by mouth 2 (two) times daily.   [DISCONTINUED] furosemide  (LASIX ) 20 MG tablet Take 1 tablet (20 mg total) by mouth daily.   [DISCONTINUED] spironolactone  (ALDACTONE ) 25 MG tablet Take 25 mg by mouth daily.   [DISCONTINUED] telmisartan  (MICARDIS ) 80 MG tablet Take 1 tablet (80 mg total) by mouth daily.   atorvastatin  (LIPITOR)  20 MG tablet Take 1 tablet (20 mg total) by mouth daily.   carvedilol  (COREG ) 25 MG tablet Take 1 tablet (25 mg total) by mouth 2 (two) times daily.   furosemide  (LASIX ) 20 MG tablet Take 1 tablet (20 mg total) by mouth daily.   spironolactone  (ALDACTONE ) 25 MG tablet Take 1 tablet (25 mg total) by mouth daily.   telmisartan  (MICARDIS ) 80 MG tablet Take 1 tablet (80 mg total) by mouth daily.   [DISCONTINUED] aspirin  81 MG chewable tablet Chew 1 tablet (81 mg total) by mouth daily. (Patient not taking: Reported on 07/27/2023)   [DISCONTINUED] JARDIANCE  10 MG TABS tablet Take 10 mg by mouth daily. (Patient not taking: Reported on 07/27/2023)   [DISCONTINUED] metFORMIN (GLUCOPHAGE-XR) 750 MG 24 hr tablet Take 750 mg by mouth daily. (Patient not taking: Reported on 07/27/2023)   [DISCONTINUED] varenicline (CHANTIX) 1 MG tablet Take by mouth. (Patient not taking: Reported on 07/27/2023)   No facility-administered encounter medications on file as of 07/27/2023.    Past Medical History:  Diagnosis Date   Abnormal finding on imaging of liver    Hypertension    Non-ischemic cardiomyopathy (HCC)    Pulmonary hypertension (HCC)    Systolic heart failure (HCC)    Tobacco use disorder     Past Surgical History:  Procedure Laterality Date  RIGHT/LEFT HEART CATH AND CORONARY ANGIOGRAPHY N/A 09/25/2020   Procedure: RIGHT/LEFT HEART CATH AND CORONARY ANGIOGRAPHY;  Surgeon: Arty Binning, MD;  Location: [facility];  Service: Cardiovascular;  Laterality: N/A;    Family History  Problem Relation Age of Onset   Cancer Father     Social History   Socioeconomic History   Marital status: Widowed    Spouse name: Not on file   Number of children: Not on file   Years of education: Not on file   Highest education level: Not on file  Occupational History   Not on file  Tobacco Use   Smoking status: Every Day    Current packs/day: 1.00    Types: Cigarettes   Smokeless tobacco: Never  Substance  and Sexual Activity   Alcohol use: Not Currently   Drug use: Never   Sexual activity: Not on file  Other Topics Concern   Not on file  Social History Narrative   Not on file   Social Drivers of Health   Financial Resource Strain: High Risk (09/24/2020)   Overall Financial Resource Strain (CARDIA)    Difficulty of Paying Living Expenses: Hard  Food Insecurity: Food Insecurity Present (09/24/2020)   Hunger Vital Sign    Worried About Running Out of Food in the Last Year: Sometimes true    Ran Out of Food in the Last Year: Sometimes true  Transportation Needs: No Transportation Needs (09/24/2020)   PRAPARE - Administrator, Civil Service (Medical): No    Lack of Transportation (Non-Medical): No  Physical Activity: Not on file  Stress: Not on file  Social Connections: Not on file  Intimate Partner Violence: Not on file        Objective    BP (!) 142/82   Pulse 72   Wt 159 lb (72.1 kg)   SpO2 98%   BMI 27.29 kg/m   Physical Exam  Gen: Well-appearing Eyes: Bilateral cataracts Ears: Normal-appearing Neck: Normal thyroid , no adenopathy Heart: Regular, no murmur Lungs: Unlabored, mild crackles on the left side, no wheezing Abd: Soft, no organomegaly Ext: Warm, well-perfused, no edema, normal joints Psych: Appropriate mood and affect, pleasant to talk with, mildly anxious appearing, not depressed appearing Neuro: Alert, conversational, full strength upper and lower extremities, normal gait      Assessment & Plan:   Problem List Items Addressed This Visit       High   Chronic HFrEF (heart failure with reduced ejection fraction) (HCC)   Chronic heart failure with previously reduced ejection fraction, much improved on last echo in 2024 with medication management.  This was a nonischemic cardiomyopathy, perhaps stress-induced.  On exam he is well compensated, euvolemic.  He has NYHA class II symptoms.  He has some dyspnea on exertion that is limiting some of  his work, but hard to tell if this is due to emphysema lung disease for from his heart disease.  Will continue with medical management, blood pressure control.      Relevant Medications   atorvastatin  (LIPITOR) 20 MG tablet   carvedilol  (COREG ) 25 MG tablet   furosemide  (LASIX ) 20 MG tablet   spironolactone  (ALDACTONE ) 25 MG tablet   telmisartan  (MICARDIS ) 80 MG tablet   Essential hypertension   Chronic, blood pressure elevated today.  Also elevated recently at his eye doctor appointments.  This is preventing him from having the cataract surgery.  I think it is most likely due to running out of telmisartan  1  month ago.  Will restart this medication and I expect to see his blood pressure improved.      Relevant Medications   atorvastatin  (LIPITOR) 20 MG tablet   carvedilol  (COREG ) 25 MG tablet   furosemide  (LASIX ) 20 MG tablet   spironolactone  (ALDACTONE ) 25 MG tablet   telmisartan  (MICARDIS ) 80 MG tablet   Generalized anxiety disorder   Chronic untreated problem.  Strong family history of anxiety disorders.  Recently made worse by losing his job, so I think he has a component of adjustment disorder.  Mostly impacting his sleep quality.  We talked about goals of medication management, set reasonable expectations, discussed side effects of SSRIs.  Plan to start Lexapro  10 mg daily.      Relevant Medications   escitalopram  (LEXAPRO ) 10 MG tablet     Medium    Tobacco use disorder   Chronic and stable issue.  Has tried Chantix in the past but had some bad side effects.      Centrilobular emphysema (HCC)   Chronic and stable.  Moderate symptom burden of dyspnea with exertion.  Starting to become functional limiting for him.  He had pulmonary function testing in 2023 which did not show fixed obstructive disease.  He does have centrilobular emphysema on imaging.  Will treat with Symbicort, hopeful this will improve some of his exertional capacity.      Pure hypercholesterolemia - Primary    Relevant Medications   atorvastatin  (LIPITOR) 20 MG tablet   carvedilol  (COREG ) 25 MG tablet   furosemide  (LASIX ) 20 MG tablet   spironolactone  (ALDACTONE ) 25 MG tablet   telmisartan  (MICARDIS ) 80 MG tablet   Other Relevant Orders   Lipid panel     Low   Abdominal aortic aneurysm (AAA) without rupture (HCC)   3.5 cm infrarenal abdominal aortic aneurysm, asymptomatic.  Next due for imaging surveillance in June 2025.      Relevant Medications   atorvastatin  (LIPITOR) 20 MG tablet   carvedilol  (COREG ) 25 MG tablet   furosemide  (LASIX ) 20 MG tablet   spironolactone  (ALDACTONE ) 25 MG tablet   telmisartan  (MICARDIS ) 80 MG tablet   Prediabetes   Relevant Orders   Hemoglobin A1c   Other Visit Diagnoses       Chronic systolic CHF (congestive heart failure) (HCC)       Relevant Medications   atorvastatin  (LIPITOR) 20 MG tablet   carvedilol  (COREG ) 25 MG tablet   furosemide  (LASIX ) 20 MG tablet   spironolactone  (ALDACTONE ) 25 MG tablet   telmisartan  (MICARDIS ) 80 MG tablet   Other Relevant Orders   Basic metabolic panel with GFR     Coronary artery disease of native artery of native heart with stable angina pectoris (HCC)       Relevant Medications   escitalopram  (LEXAPRO ) 10 MG tablet   atorvastatin  (LIPITOR) 20 MG tablet   carvedilol  (COREG ) 25 MG tablet   furosemide  (LASIX ) 20 MG tablet   spironolactone  (ALDACTONE ) 25 MG tablet   telmisartan  (MICARDIS ) 80 MG tablet       Return in about 3 months (around 10/26/2023) for hypertension management.   Ether Hercules, MD

## 2023-07-27 NOTE — Assessment & Plan Note (Addendum)
 Chronic heart failure with previously reduced ejection fraction, much improved on last echo in 2024 with medication management.  This was a nonischemic cardiomyopathy, perhaps stress-induced.  On exam he is well compensated, euvolemic.  He has NYHA class II symptoms.  He has some dyspnea on exertion that is limiting some of his work, but hard to tell if this is due to emphysema lung disease for from his heart disease.  Will continue with medical management, blood pressure control.

## 2023-07-27 NOTE — Assessment & Plan Note (Signed)
 Chronic, blood pressure elevated today.  Also elevated recently at his eye doctor appointments.  This is preventing him from having the cataract surgery.  I think it is most likely due to running out of telmisartan  1 month ago.  Will restart this medication and I expect to see his blood pressure improved.

## 2023-07-28 ENCOUNTER — Telehealth: Payer: Self-pay

## 2023-07-28 NOTE — Telephone Encounter (Signed)
-----   Message from Ether Hercules sent at 07/28/2023  7:44 AM EDT ----- Please inform Juan Kent that his blood work all looks fine.  Any function electrolytes are good levels.  Blood sugar is at a good level.  Cholesterol is slightly high, I will talk to him at our next visit about potential changes he can make to his cholesterol medication.  Thank you

## 2023-08-01 ENCOUNTER — Ambulatory Visit: Payer: Self-pay | Admitting: *Deleted

## 2023-08-01 MED ORDER — LOSARTAN POTASSIUM-HCTZ 50-12.5 MG PO TABS: 1.0000 | ORAL_TABLET | Freq: Every day | ORAL | 1 refills | Status: DC

## 2023-08-01 NOTE — Addendum Note (Signed)
 Addended by: Juel Nutley T on: 08/01/2023 04:39 PM   Modules accepted: Orders

## 2023-08-01 NOTE — Telephone Encounter (Signed)
  Chief Complaint: elevated BP  Symptoms: BP 200/109 Pulse 68 rechecked for BP 185/97 . Denies sx now. Has had episodes of headache, blurred vision, dizziness. Diarrhea slowed down. Patient has not been taking medication correctly. Coreg  25 mg taken once daily and not 2 times daily since 07/27/23.  Frequency: since 07/27/23 Pertinent Negatives: Patient denies chest pain no difficulty breathing no weakness either side of body no N/T.  Disposition: [] ED /[] Urgent Care (no appt availability in office) / [] Appointment(In office/virtual)/ []  [facility]/ [] Home Care/ [] Refused Recommended Disposition /[] [facility]/ [x]  Follow-up with PCP Additional Notes:   Last OV 07/27/23. Patient requesting if BP will cause issues with scheduled surgery for May 7 with [facility]. PCP needs to contact if patient doesn't clear for surgery. Instructed patient to take medication as directed and check BP keep log and if BP remains elevated call back . If sx occur call back or go to ED if BP elevated with sx. Please advise if another OV needed at this time.       Copied from CRM [id number]. Topic: Clinical - Red Word Triage >> Aug 01, 2023  1:47 PM Taleah C wrote: Red Word that prompted transfer to Nurse Triage: high BP at 210/125, Dizzyness for several days Reason for Disposition  [1] Systolic BP  >= 130 OR Diastolic >= 80 AND [2] taking BP medications  Answer Assessment - Initial Assessment Questions 1. BLOOD PRESSURE: "What is the blood pressure?" "Did you take at least two measurements 5 minutes apart?"     BP 200/109 pulse 68  rechecking for BP 185/97 2. ONSET: "When did you take your blood pressure?"     Now  3. HOW: "How did you take your blood pressure?" (e.g., automatic home BP monitor, visiting nurse)     Automatic BP cuff  4. HISTORY: "Do you have a history of high blood pressure?"     Hx  5. MEDICINES: "Are you taking any medicines for blood pressure?" "Have you missed any  doses recently?"     No but has been taking coreg  1 time daily instead of 2 times daily  6. OTHER SYMPTOMS: "Do you have any symptoms?" (e.g., blurred vision, chest pain, difficulty breathing, headache, weakness)     No sx now . Did have  headache, dizziness, diarrhea.blurred vision prior to today  7. PREGNANCY: "Is there any chance you are pregnant?" "When was your last menstrual period?"     na  Protocols used: Blood Pressure - High-A-AH

## 2023-08-01 NOTE — Telephone Encounter (Addendum)
 I spoke with the patient by phone.  He restarted telmisartan  on Friday.  This was a chronic medication for him that he had been out of for about a month.  But it sounds like he is not having some lightheadedness and abdominal discomfort.  Also does not sound like it is effective as he still hypertensive.  Adherence with his other medications.  No chest pain or shortness of breath or other high risk features.  I think this is more than a medication adverse side effect rather than symptomatic hypertension.  We decided to discontinue telmisartan .  Instead I am going to prescribe losartan  HCTZ combination, this might help us  bring down the dose of the ARB which may be causing him some discomfort.  Also will recommend stopping furosemide  now that he is starting a thiazide diuretic.  And I recommended stopping escitalopram , this is also a new medication as of Friday and may be contributing to some of the lightheadedness and GI symptoms.

## 2023-08-01 NOTE — Addendum Note (Signed)
 Addended by: Juel Nutley T on: 08/01/2023 04:35 PM   Modules accepted: Orders

## 2023-08-01 NOTE — Addendum Note (Signed)
 Addended by: Juel Nutley T on: 08/01/2023 04:43 PM   Modules accepted: Orders

## 2023-08-01 NOTE — Telephone Encounter (Signed)
 Patient states he has been taking the Telmisartan  and that is when the headaches, double vision,dizziness and diarrhea started. Patient states he has not stopped this mediation even with the symptoms he is having. Telmisartan  does not seem to be helping either patient states. While being on phone patient took BP and got a reading of 179/91 with a pulse of 70.   Patient states he is feeling a little better but still some dizziness. Did take Coreg  and Telmisartan  today as prescribed.

## 2023-08-01 NOTE — Telephone Encounter (Signed)
 At last visit, I prescribed him telmisartan  for hypertension. Can we check with him to see if he has started that medication?

## 2023-08-09 DIAGNOSIS — I110 Hypertensive heart disease with heart failure: Secondary | ICD-10-CM | POA: Diagnosis not present

## 2023-08-09 DIAGNOSIS — I509 Heart failure, unspecified: Secondary | ICD-10-CM | POA: Diagnosis not present

## 2023-08-09 DIAGNOSIS — H2512 Age-related nuclear cataract, left eye: Secondary | ICD-10-CM | POA: Diagnosis not present

## 2023-08-24 ENCOUNTER — Telehealth: Payer: Self-pay | Admitting: Student in an Organized Health Care Education/Training Program

## 2023-08-24 NOTE — Telephone Encounter (Signed)
 Upon review patient rxs were sent to the pharmacy asked that patient call the pharmacy and check in with them to see if they have the refills on file.

## 2023-08-24 NOTE — Telephone Encounter (Signed)
 Called pharmacy to confirm patient had refills on file patient has been dispensed 90 day supplies of both medications at the end of last month patient is not eligible for refills at this time.

## 2023-08-24 NOTE — Telephone Encounter (Signed)
 Med Refill Req for meds that was prescribed before establishing care with you. The follow meds are listed below.. Carvedilol  12.5 * MG  Spironolact 25 * MG  Meds was prescribed by previous doctor Dr. Alvie Ax  PT follow appt after new pt appt is scheduled on 7.24.25 @ 10:40Am pt is aware of appt but will like meds because he is out.Aaron Aas   Please advise, Thanks

## 2023-08-25 DIAGNOSIS — H25811 Combined forms of age-related cataract, right eye: Secondary | ICD-10-CM | POA: Diagnosis not present

## 2023-08-25 DIAGNOSIS — I110 Hypertensive heart disease with heart failure: Secondary | ICD-10-CM | POA: Diagnosis not present

## 2023-08-25 DIAGNOSIS — I509 Heart failure, unspecified: Secondary | ICD-10-CM | POA: Diagnosis not present

## 2023-08-25 DIAGNOSIS — H2511 Age-related nuclear cataract, right eye: Secondary | ICD-10-CM | POA: Diagnosis not present

## 2023-10-03 DIAGNOSIS — H40033 Anatomical narrow angle, bilateral: Secondary | ICD-10-CM | POA: Diagnosis not present

## 2023-10-03 DIAGNOSIS — H04123 Dry eye syndrome of bilateral lacrimal glands: Secondary | ICD-10-CM | POA: Diagnosis not present

## 2023-10-26 ENCOUNTER — Ambulatory Visit (INDEPENDENT_AMBULATORY_CARE_PROVIDER_SITE_OTHER): Admitting: Student in an Organized Health Care Education/Training Program

## 2023-10-26 ENCOUNTER — Encounter: Payer: Self-pay | Admitting: Student in an Organized Health Care Education/Training Program

## 2023-10-26 VITALS — BP 156/90 | HR 69 | Wt 160.0 lb

## 2023-10-26 DIAGNOSIS — E7800 Pure hypercholesterolemia, unspecified: Secondary | ICD-10-CM | POA: Diagnosis not present

## 2023-10-26 DIAGNOSIS — I5022 Chronic systolic (congestive) heart failure: Secondary | ICD-10-CM | POA: Diagnosis not present

## 2023-10-26 DIAGNOSIS — F411 Generalized anxiety disorder: Secondary | ICD-10-CM

## 2023-10-26 DIAGNOSIS — F17200 Nicotine dependence, unspecified, uncomplicated: Secondary | ICD-10-CM | POA: Diagnosis not present

## 2023-10-26 DIAGNOSIS — I10 Essential (primary) hypertension: Secondary | ICD-10-CM

## 2023-10-26 DIAGNOSIS — E611 Iron deficiency: Secondary | ICD-10-CM | POA: Insufficient documentation

## 2023-10-26 MED ORDER — LOSARTAN POTASSIUM-HCTZ 100-25 MG PO TABS: 1.0000 | ORAL_TABLET | Freq: Every day | ORAL | 3 refills | Status: AC

## 2023-10-26 NOTE — Assessment & Plan Note (Signed)
 Blood pressure is variable, with office readings at 156/91 mmHg and home readings at 127/71 mmHg. He is sensitive to medications and currently on losartan , HCTZ, and carvedilol . Increase losartan  and HCTZ strength and send to pharmacy. Continue carvedilol  twice daily and spironolactone . Monitor home blood pressure and report any changes.

## 2023-10-26 NOTE — Assessment & Plan Note (Signed)
 Chronic and stable.  We tried to start Lexapro , but he had some bad side effects of lightheadedness and changes in his blood pressure.  Little hard to tell if that was due to some of his other medication changes.  For now he reports that his anxiety is stable and does not want to try the Lexapro  again.

## 2023-10-26 NOTE — Assessment & Plan Note (Signed)
 Symptoms of lower extremity discomfort may be a myalgia related to the statin.  I am going to check a CK and liver enzymes to rule out myositis.  Will check LDL again, hoping it is better below 100

## 2023-10-26 NOTE — Patient Instructions (Signed)
  VISIT SUMMARY: Today, you were seen for leg fatigue and weakness. We discussed your history of hypertension, heart failure, and coronary artery disease. You also mentioned your desire to quit smoking and your experience with sinus drainage at night.  YOUR PLAN: -HYPERTENSION: Hypertension means high blood pressure. Your blood pressure readings have been variable. We will increase the strength of your losartan  and HCTZ and continue your carvedilol . Please monitor your blood pressure at home and report any changes.  -CHRONIC HEART FAILURE WITH REDUCED EJECTION FRACTION: Chronic heart failure with reduced ejection fraction means your heart is not pumping blood as well as it should. You reported fatigue and leg weakness. We will continue your carvedilol  and spironolactone  and order blood work to check for anemia and iron levels. We will also consider if you need to restart digoxin .  -CORONARY ARTERY DISEASE WITH ANGINA: Coronary artery disease with angina means you have narrowed heart arteries, but you did not report chest pain. We will continue your atorvastatin  and aspirin , and consider reducing the aspirin  dose to 81 mg.  -TOBACCO USE DISORDER: Tobacco use disorder means you are dependent on smoking. You smoke two packs daily and want to quit, but have had side effects with Chantix. We will discuss other options to help you quit smoking.  -GENERAL HEALTH MAINTENANCE: Your muscle soreness may be due to physical activity or your atorvastatin . Your sinus drainage at night may be from allergies. We will do blood work to check for muscle issues related to your statin use.  INSTRUCTIONS: Please monitor your blood pressure at home and report any changes. We will order blood work to check for anemia, iron levels, and muscle issues related to your statin use. Consider reducing your aspirin  dose to 81 mg. We will discuss other options to help you quit smoking.

## 2023-10-26 NOTE — Assessment & Plan Note (Signed)
 Chronic and stable. He reports fatigue and leg weakness.  Looks to be well compensated with NYHA class I symptoms.  Currently on carvedilol , losartan  and spironolactone . Continue carvedilol  twice daily, increase losartan  to 100 mg daily and continue spironolactone  once daily.  Unable to afford Entresto  or Farxiga in the past.  Patient discontinued digoxin  himself, seems okay given that he is well compensated and functional at home.  No need for Lasix  at this point.

## 2023-10-26 NOTE — Assessment & Plan Note (Addendum)
 Symptoms of lower extremity fatigue and restlessness sound consistent with an iron deficiency.  Will check CBC and iron levels today.  Given his comorbid heart failure would want to keep the ferritin above 100.

## 2023-10-26 NOTE — Assessment & Plan Note (Signed)
 He smokes two packs daily and desires cessation but experiences difficulty with Chantix side effects.

## 2023-10-26 NOTE — Progress Notes (Signed)
 Established Patient Office Visit  Subjective   Patient ID: Juan Kent, male    DOB: Jan 25, 1956  Age: 68 y.o. MRN: [id number]  Chief Complaint  Patient presents with   Medical Management of Chronic Issues    3 month follow up     HPI   Discussed the use of AI scribe software for clinical note transcription with the patient, who gave verbal consent to proceed.  History of Present Illness Donald Memoli is a 68 year old male with hypertension and heart failure who presents with leg fatigue and weakness.  He experiences significant fatigue and weakness in his legs, which limits his ability to walk long distances. He can walk the length of a football field or half of it but experiences tightness and a 'rubber leg' feeling, particularly after climbing stairs. No chest pain or breathing difficulties during these activities. He has been trying to increase his activity level by walking on a treadmill, managing two miles at a slow pace, but finds it very tiring.  He has a history of heart problems and was previously hospitalized, impacting his recovery and physical strength. He is currently not taking digoxin  as he has run out of it. His current medications include carvedilol  twice a day, spironolactone  once a day, atorvastatin  once a day, and Hyzaar for blood pressure. He also takes aspirin , usually a regular dose, and is trying to avoid ibuprofen due to its effect on blood pressure.  He experiences muscle soreness, which he attributes to physical activities such as working on cars and cutting down trees. He has a history of high cholesterol and has experienced leg cramps in the past, which he associates with atorvastatin  use. He reports that his legs give out quickly when walking.  He smokes about two packs of cigarettes a day and wants to quit, although he has experienced side effects with medications like Chantix. He also reports sinus drainage at night, leading to a sore throat, which he  attributes to allergies.    Objective:     BP (!) 156/90   Pulse 69   Wt 160 lb (72.6 kg)   SpO2 97%   BMI 27.46 kg/m   Physical Exam  Gen: Well-appearing man Heart: Regular, 3 out of 6 early systolic murmur at the left upper sternal border Lungs: Unlabored, clear throughout with some mild pitched wheezing on the right side Ext: Warm, well-perfused, no edema, no wounds    Assessment & Plan:   Problem List Items Addressed This Visit       High   Chronic HFrEF (heart failure with reduced ejection fraction) (HCC) (Chronic)   Chronic and stable. He reports fatigue and leg weakness.  Looks to be well compensated with NYHA class I symptoms.  Currently on carvedilol , losartan  and spironolactone . Continue carvedilol  twice daily, increase losartan  to 100 mg daily and continue spironolactone  once daily.  Unable to afford Entresto  or Farxiga in the past.  Patient discontinued digoxin  himself, seems okay given that he is well compensated and functional at home.  No need for Lasix  at this point.      Relevant Medications   losartan -hydrochlorothiazide (HYZAAR) 100-25 MG tablet   Essential hypertension - Primary (Chronic)   Blood pressure is variable, with office readings at 156/91 mmHg and home readings at 127/71 mmHg. He is sensitive to medications and currently on losartan , HCTZ, and carvedilol . Increase losartan  and HCTZ strength and send to pharmacy. Continue carvedilol  twice daily and spironolactone . Monitor home blood pressure and report  any changes.      Relevant Medications   losartan -hydrochlorothiazide (HYZAAR) 100-25 MG tablet   Other Relevant Orders   Comprehensive metabolic panel with GFR   Generalized anxiety disorder (Chronic)   Chronic and stable.  We tried to start Lexapro , but he had some bad side effects of lightheadedness and changes in his blood pressure.  Little hard to tell if that was due to some of his other medication changes.  For now he reports that his  anxiety is stable and does not want to try the Lexapro  again.        Medium    Tobacco use disorder (Chronic)   He smokes two packs daily and desires cessation but experiences difficulty with Chantix side effects.      Pure hypercholesterolemia (Chronic)   Symptoms of lower extremity discomfort may be a myalgia related to the statin.  I am going to check a CK and liver enzymes to rule out myositis.  Will check LDL again, hoping it is better below 100      Relevant Medications   losartan -hydrochlorothiazide (HYZAAR) 100-25 MG tablet   Other Relevant Orders   Comprehensive metabolic panel with GFR   Lipid panel   CK     Unprioritized   Iron deficiency   Symptoms of lower extremity fatigue and restlessness sound consistent with an iron deficiency.  Will check CBC and iron levels today.  Given his comorbid heart failure would want to keep the ferritin above 100.      Relevant Orders   CBC   IBC + Ferritin    Return in about 2 months (around 12/27/2023) for Hypertension management.    Cleatus Debby Specking, MD

## 2023-10-27 LAB — COMPREHENSIVE METABOLIC PANEL WITH GFR
ALT: 13 U/L (ref 0–53)
AST: 12 U/L (ref 0–37)
Albumin: 4.3 g/dL (ref 3.5–5.2)
Alkaline Phosphatase: 77 U/L (ref 39–117)
BUN: 22 mg/dL (ref 6–23)
CO2: 28 meq/L (ref 19–32)
Calcium: 9.7 mg/dL (ref 8.4–10.5)
Chloride: 103 meq/L (ref 96–112)
Creatinine, Ser: 0.93 mg/dL (ref 0.40–1.50)
GFR: 85.02 mL/min (ref >60.00)
Glucose, Bld: 99 mg/dL (ref 70–99)
Potassium: 5 meq/L (ref 3.5–5.1)
Sodium: 140 meq/L (ref 135–145)
Total Bilirubin: 0.4 mg/dL (ref 0.2–1.2)
Total Protein: 7 g/dL (ref 6.0–8.3)

## 2023-10-27 LAB — IBC + FERRITIN
Ferritin: 65.2 ng/mL (ref 22.0–322.0)
Iron: 49 ug/dL (ref 42–165)
Saturation Ratios: 15.6 % — ABNORMAL LOW (ref 20.0–50.0)
TIBC: 315 ug/dL (ref 250.0–450.0)
Transferrin: 225 mg/dL (ref 212.0–360.0)

## 2023-10-27 LAB — LIPID PANEL
Cholesterol: 153 mg/dL (ref 0–200)
HDL: 37.1 mg/dL — ABNORMAL LOW (ref >39.00)
LDL Cholesterol: 93 mg/dL (ref 0–99)
NonHDL: 116.27
Total CHOL/HDL Ratio: 4
Triglycerides: 117 mg/dL (ref 0.0–149.0)
VLDL: 23.4 mg/dL (ref 0.0–40.0)

## 2023-10-27 LAB — CBC
HCT: 46.9 % (ref 39.0–52.0)
Hemoglobin: 14.9 g/dL (ref 13.0–17.0)
MCHC: 31.7 g/dL (ref 30.0–36.0)
MCV: 92.8 fl (ref 78.0–100.0)
Platelets: 313 K/uL (ref 150.0–400.0)
RBC: 5.06 Mil/uL (ref 4.22–5.81)
RDW: 17.1 % — ABNORMAL HIGH (ref 11.5–15.5)
WBC: 14.7 K/uL — ABNORMAL HIGH (ref 4.0–10.5)

## 2023-10-27 LAB — CK: Total CK: 79 U/L (ref 17–232)

## 2023-10-30 ENCOUNTER — Ambulatory Visit: Payer: Self-pay | Admitting: Student in an Organized Health Care Education/Training Program

## 2023-10-30 DIAGNOSIS — E611 Iron deficiency: Secondary | ICD-10-CM

## 2023-10-30 DIAGNOSIS — F411 Generalized anxiety disorder: Secondary | ICD-10-CM

## 2023-10-30 MED ORDER — ESCITALOPRAM OXALATE 10 MG PO TABS: 10.0000 mg | ORAL_TABLET | Freq: Every day | ORAL | 2 refills | Status: AC

## 2023-10-30 MED ORDER — IRON (FERROUS SULFATE) 325 (65 FE) MG PO TABS: 325.0000 mg | ORAL_TABLET | Freq: Every day | ORAL | 2 refills | Status: AC

## 2023-12-27 ENCOUNTER — Encounter: Payer: Self-pay | Admitting: Student in an Organized Health Care Education/Training Program

## 2023-12-27 ENCOUNTER — Ambulatory Visit: Admitting: Student in an Organized Health Care Education/Training Program

## 2023-12-27 VITALS — BP 171/115 | HR 109 | Wt 167.0 lb

## 2023-12-27 DIAGNOSIS — F411 Generalized anxiety disorder: Secondary | ICD-10-CM

## 2023-12-27 DIAGNOSIS — Z23 Encounter for immunization: Secondary | ICD-10-CM

## 2023-12-27 DIAGNOSIS — I10 Essential (primary) hypertension: Secondary | ICD-10-CM

## 2023-12-27 DIAGNOSIS — J432 Centrilobular emphysema: Secondary | ICD-10-CM

## 2023-12-27 DIAGNOSIS — M7021 Olecranon bursitis, right elbow: Secondary | ICD-10-CM

## 2023-12-27 DIAGNOSIS — Z1211 Encounter for screening for malignant neoplasm of colon: Secondary | ICD-10-CM

## 2023-12-27 DIAGNOSIS — I5022 Chronic systolic (congestive) heart failure: Secondary | ICD-10-CM | POA: Diagnosis not present

## 2023-12-27 DIAGNOSIS — E611 Iron deficiency: Secondary | ICD-10-CM

## 2023-12-27 DIAGNOSIS — F17200 Nicotine dependence, unspecified, uncomplicated: Secondary | ICD-10-CM

## 2023-12-27 MED ORDER — TIOTROPIUM BROMIDE MONOHYDRATE 18 MCG IN CAPS: 18.0000 ug | ORAL_CAPSULE | Freq: Every day | RESPIRATORY_TRACT | 12 refills | Status: AC

## 2023-12-27 NOTE — Assessment & Plan Note (Signed)
 Symptoms are stable with leg tightness and fatigue, and physical activity tolerance is noted. Restart carvedilol  and ensure uninterrupted medication supply with pharmacy follow-up.  Appears euvolemic on exam and well compensated.  Class II NYHA symptoms, but I think he has a lot of symptoms from his pulmonary disease contributing.  He is mildly tachycardic today, I think from running out of carvedilol .  We talked about restarting his medications and good consistency with the medicines.  Needs to follow-up with the pharmacy more regularly when he needs refills.  Continue with Hyzaar and spironolactone .

## 2023-12-27 NOTE — Patient Instructions (Signed)
  VISIT SUMMARY: During your visit, we discussed your current medications, symptoms, and overall health. We addressed your concerns about nervousness, leg tightness, occasional chest pain, and sleep disturbances. We also reviewed your medication adherence and discussed the importance of consistent use. Additionally, we talked about your chronic right elbow issue and the need for a colonoscopy due to your low iron  levels.  YOUR PLAN: -CHRONIC HEART FAILURE WITH REDUCED EJECTION FRACTION (HFREF): This condition means your heart is not pumping blood as well as it should. We will restart your carvedilol  medication and ensure you have a consistent supply from the pharmacy.  -ESSENTIAL HYPERTENSION: This is high blood pressure. Your blood pressure is currently controlled with Hyzaar, and you should continue taking it regularly. We will ensure you have a consistent supply from the pharmacy.  -IRON  DEFICIENCY ANEMIA: This condition means you have low iron  levels, which can cause fatigue. We discussed the importance of screening for gastrointestinal bleeding and colon cancer. You will be referred to a specialist for a colonoscopy.  -CHRONIC RIGHT OLECRANON BURSITIS WITH DRAINAGE: This is a condition where the bursa in your right elbow is inflamed and draining fluid. Keep the area clean and covered, watch for signs of infection, and contact us  if you notice any signs of infection.  -GENERALIZED ANXIETY DISORDER: This condition involves excessive worry about various aspects of life. We will continue to monitor your symptoms and adjust treatment as needed.  -INSOMNIA: This is difficulty sleeping. You are currently using Tylenol  PM with Benadryl. Continue this regimen with caution, as Benadryl can have side effects.  -TOBACCO USE DISORDER: This means you are dependent on tobacco. You are open to using an inhaler to help with breathing. We will prescribe an inhaler for you to use as  needed.  INSTRUCTIONS: Please follow up with the pharmacy to ensure you have a consistent supply of your medications. Schedule a colonoscopy with a gastrointestinal specialist to screen for potential causes of your low iron  levels. Monitor your right elbow for signs of infection and contact us  if you notice any changes. Continue your current sleep regimen with caution and use the prescribed inhaler as needed for breathing issues.

## 2023-12-27 NOTE — Assessment & Plan Note (Signed)
 Blood pressure is controlled at 135/96 mmHg on Hyzaar. Continue Hyzaar and ensure uninterrupted medication supply with pharmacy follow-up.

## 2023-12-27 NOTE — Assessment & Plan Note (Signed)
 Iron  deficiency likely contributes to fatigue. No history of colonoscopy; discussed the importance of screening for GI bleeding and colon cancer. Refer to GI for colonoscopy.

## 2023-12-27 NOTE — Progress Notes (Signed)
 Established Patient Office Visit  Subjective   Patient ID: Juan Kent, male    DOB: 1956/04/04  Age: 68 y.o. MRN: [id number]  Chief Complaint  Patient presents with   Hypertension    2 month follow up     HPI  Discussed the use of AI scribe software for clinical note transcription with the patient, who gave verbal consent to proceed.  History of Present Illness Veronica Guerrant is a 68 year old male who presents for medication management and follow-up.  He feels nervous about various aspects of life, describing the world as 'crazy'. He is currently out of carvedilol  and escitalopram  but has Hyzaar available. He has been inconsistent with taking his iron  tablets due to forgetfulness.  His legs feel tight and fatigued, and he reports that this seems to have improved somewhat with iron  supplementation. He can walk around a grocery store without resting but finds larger stores more tiring. Uphill and downhill walking, as well as yard work, are particularly challenging. 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  He experiences occasional chest pain, which he associates with overeating, and has been avoiding chocolate chip cookies and ice cream. He reports swelling in his legs only if his socks are too tight. No lightheadedness or feelings of passing out.  He has a history of a leaking right elbow for the past three to four years, which occasionally squirts a clear liquid. It does not cause pain unless bumped and has never been infected. He uses Neosporin occasionally to soften the skin.  He has been experiencing sleep disturbances, waking up two to three times a night, and uses Tylenol  PM to help with sleep. He denies using any inhalers for breathing issues and has not used one in the past.  His family history includes a cousin who had colon cancer and recently passed away. His mother had shingles but continued to work  through it. His mother and girlfriend are encouraging him to get a colonoscopy due to his low iron  levels.     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  It was draining mildly purulent fluid, not sanguinous.  Minimal erythema around the bursa and there is no tenderness to palpation.      Assessment & Plan:   Problem List Items Addressed This Visit       High   Chronic HFrEF (heart failure with reduced ejection fraction) (HCC) (Chronic)   Symptoms are stable with leg tightness and fatigue, and physical activity tolerance is noted. Restart carvedilol  and ensure uninterrupted medication supply with pharmacy follow-up.  Appears euvolemic on exam and well compensated.  Class II NYHA symptoms, but I think he has a lot of symptoms from his pulmonary disease contributing.  He is mildly tachycardic today, I think from running out of carvedilol .  We talked about restarting his medications and good consistency with the medicines.  Needs to follow-up with the pharmacy more regularly when he needs refills.  Continue with Hyzaar and spironolactone .  Essential hypertension (Chronic)   Blood pressure is controlled at 135/96 mmHg on Hyzaar. Continue Hyzaar and ensure uninterrupted medication supply with pharmacy follow-up.      Generalized anxiety disorder - Primary (Chronic)   Chronic and stable.  Still struggles with anxiety related to chronic illness.  Has had a lot of adjustment to this.  I recommended restarting Lexapro , and avoiding interruptions in the future.        Medium    Centrilobular emphysema (HCC) (Chronic)    Chronic issue.  Patient has declined inhalers in the past.  Open to it now.  Hopeful that it will improve some of his exertional capacity.  Will start Spiriva .  Tobacco use disorder continues to be a problem.      Relevant Medications   tiotropium (SPIRIVA  HANDIHALER) 18 MCG inhalation capsule   Tobacco use disorder (Chronic)   Iron  deficiency   Iron  deficiency likely contributes to fatigue. No history of colonoscopy; discussed the importance of screening for GI bleeding and colon cancer. Refer to GI for colonoscopy.        Low   Olecranon bursitis of right elbow (Chronic)   Chronic drainage is present without infection or significant pain. Discussed infection risk. Keep the area clean and covered, monitor for infection signs, and contact the provider if infection signs occur.  I offered him consultation with orthopedics, he declined because he does not think it is bothering him.      Other Visit Diagnoses       Screening for colon cancer       Relevant Orders   Ambulatory referral to Gastroenterology       Return in about 3 months (around 03/27/2024).    Cleatus Debby Specking, MD

## 2023-12-27 NOTE — Assessment & Plan Note (Signed)
 Chronic and stable.  Still struggles with anxiety related to chronic illness.  Has had a lot of adjustment to this.  I recommended restarting Lexapro , and avoiding interruptions in the future.

## 2023-12-27 NOTE — Assessment & Plan Note (Signed)
 Chronic drainage is present without infection or significant pain. Discussed infection risk. Keep the area clean and covered, monitor for infection signs, and contact the provider if infection signs occur.  I offered him consultation with orthopedics, he declined because he does not think it is bothering him.

## 2023-12-27 NOTE — Assessment & Plan Note (Signed)
 Chronic issue.  Patient has declined inhalers in the past.  Open to it now.  Hopeful that it will improve some of his exertional capacity.  Will start Spiriva .  Tobacco use disorder continues to be a problem.

## 2024-01-24 ENCOUNTER — Ambulatory Visit (INDEPENDENT_AMBULATORY_CARE_PROVIDER_SITE_OTHER)

## 2024-01-24 VITALS — Ht 66.0 in | Wt 167.0 lb

## 2024-01-24 DIAGNOSIS — Z0000 Encounter for general adult medical examination without abnormal findings: Secondary | ICD-10-CM | POA: Diagnosis not present

## 2024-01-24 NOTE — Patient Instructions (Signed)
 [name],  Thank you for taking the time for your Medicare Wellness Visit. I appreciate your continued commitment to your health goals. Please review the care plan we discussed, and feel free to reach out if I can assist you further.  Medicare recommends these wellness visits once per year to help you and your care team stay ahead of potential health issues. These visits are designed to focus on prevention, allowing your provider to concentrate on managing your acute and chronic conditions during your regular appointments.  Please note that Annual Wellness Visits do not include a physical exam. Some assessments may be limited, especially if the visit was conducted virtually. If needed, we may recommend a separate in-person follow-up with your provider.  Ongoing Care Seeing your primary care provider every 3 to 6 months helps us  monitor your health and provide consistent, personalized care. Next office visit on 04/01/2024.    Referrals If a referral was made during today's visit and you haven't received any updates within two weeks, please contact the referred provider directly to check on the status.  [facility]. [address] [place], [place] [address] Phone: [phone]  Recommended Screenings:  Health Maintenance  Topic Date Due   DTaP/Tdap/Td vaccine (1 - Tdap) Never done   Pneumococcal Vaccine for age over 53 (1 of 2 - PCV) Never done   Zoster (Shingles) Vaccine (1 of 2) Never done   COVID-19 Vaccine (2 - Pfizer risk series) 09/27/2019   Medicare Annual Wellness Visit  01/26/2024*   Colon Cancer Screening  04/27/2024*   Flu Shot  Completed   Hepatitis C Screening  Completed   Meningitis B Vaccine  Aged Out  *Topic was postponed. The date shown is not the original due date.       01/24/2024   11:43 AM  Advanced Directives  Does Patient Have a Medical Advance Directive? No   Advance Care Planning is important because it: Ensures  you receive medical care that aligns with your values, goals, and preferences. Provides guidance to your family and loved ones, reducing the emotional burden of decision-making during critical moments.  Vision: Annual vision screenings are recommended for early detection of glaucoma, cataracts, and diabetic retinopathy. These exams can also reveal signs of chronic conditions such as diabetes and high blood pressure.  Dental: Annual dental screenings help detect early signs of oral cancer, gum disease, and other conditions linked to overall health, including heart disease and diabetes.  Please see the attached documents for additional preventive care recommendations.

## 2024-01-24 NOTE — Progress Notes (Signed)
 Subjective:   Juan Kent is a 68 y.o. who presents for a Medicare Wellness preventive visit.  As a reminder, Annual Wellness Visits don't include a physical exam, and some assessments may be limited, especially if this visit is performed virtually. We may recommend an in-person follow-up visit with your provider if needed.  Visit Complete: Virtual I connected with  Juan Kent on 01/24/24 by a audio enabled telemedicine application and verified that I am speaking with the correct person using two identifiers.  Patient Location: Home  Provider Location: Home Office  I discussed the limitations of evaluation and management by telemedicine. The patient expressed understanding and agreed to proceed.  Vital Signs: Because this visit was a virtual/telehealth visit, some criteria may be missing or patient reported. Any vitals not documented were not able to be obtained and vitals that have been documented are patient reported.  VideoDeclined- This patient declined Librarian, academic. Therefore the visit was completed with audio only.  Persons Participating in Visit: Patient.  AWV Questionnaire: No: Patient Medicare AWV questionnaire was not completed prior to this visit.  Cardiac Risk Factors include: advanced age (>50men, >4 women);Other (see comment);dyslipidemia, Risk factor comments: HFrEF     Objective:    Today's Vitals   01/24/24 1137  Weight: 167 lb (75.8 kg)  Height: 5' 6 (1.676 m)   Body mass index is 26.95 kg/m.     01/24/2024   11:43 AM 10/09/2020   10:28 AM 09/22/2020   12:00 AM 09/21/2020    4:10 PM  Advanced Directives  Does Patient Have a Medical Advance Directive? No No No No  Would patient like information on creating a medical advance directive?  No - Patient declined Yes (Inpatient - patient requests chaplain consult to create a medical advance directive)     Current Medications (verified) Outpatient Encounter Medications as of  01/24/2024  Medication Sig   atorvastatin  (LIPITOR) 20 MG tablet Take 1 tablet (20 mg total) by mouth daily.   carvedilol  (COREG ) 25 MG tablet Take 1 tablet (25 mg total) by mouth 2 (two) times daily.   cromolyn (OPTICROM) 4 % ophthalmic solution 1 drop 2 (two) times daily.   cycloSPORINE (RESTASIS) 0.05 % ophthalmic emulsion 1 drop 2 (two) times daily.   escitalopram  (LEXAPRO ) 10 MG tablet Take 1 tablet (10 mg total) by mouth daily.   Iron , Ferrous Sulfate , 325 (65 Fe) MG TABS Take 325 mg by mouth daily.   losartan -hydrochlorothiazide (HYZAAR) 100-25 MG tablet Take 1 tablet by mouth daily.   spironolactone  (ALDACTONE ) 25 MG tablet Take 1 tablet (25 mg total) by mouth daily.   tiotropium (SPIRIVA  HANDIHALER) 18 MCG inhalation capsule Place 1 capsule (18 mcg total) into inhaler and inhale daily.   No facility-administered encounter medications on file as of 01/24/2024.    Allergies (verified) Patient has no known allergies.   History: Past Medical History:  Diagnosis Date   Abnormal finding on imaging of liver    Hypertension    Non-ischemic cardiomyopathy (HCC)    Pulmonary hypertension (HCC)    Systolic heart failure (HCC)    Tobacco use disorder    Past Surgical History:  Procedure Laterality Date   RIGHT/LEFT HEART CATH AND CORONARY ANGIOGRAPHY N/A 09/25/2020   Procedure: RIGHT/LEFT HEART CATH AND CORONARY ANGIOGRAPHY;  Surgeon: Claudene Victory ORN, MD;  Location: [facility];  Service: Cardiovascular;  Laterality: N/A;   Family History  Problem Relation Age of Onset   Cancer Father  Social History   Socioeconomic History   Marital status: Widowed    Spouse name: Not on file   Number of children: 0   Years of education: Not on file   Highest education level: Not on file  Occupational History   Occupation: SEMI/RETIRED  Tobacco Use   Smoking status: Every Day    Current packs/day: 1.00    Types: Cigarettes   Smokeless tobacco: Never  Vaping Use   Vaping  status: Never Used  Substance and Sexual Activity   Alcohol use: Not Currently   Drug use: Never   Sexual activity: Not on file  Other Topics Concern   Not on file  Social History Narrative   Lives with his mother/2025   Social Drivers of Health   Financial Resource Strain: Medium Risk (01/24/2024)   Overall Financial Resource Strain (CARDIA)    Difficulty of Paying Living Expenses: Somewhat hard  Food Insecurity: No Food Insecurity (01/24/2024)   Hunger Vital Sign    Worried About Running Out of Food in the Last Year: Never true    Ran Out of Food in the Last Year: Never true  Transportation Needs: No Transportation Needs (01/24/2024)   PRAPARE - Administrator, Civil Service (Medical): No    Lack of Transportation (Non-Medical): No  Physical Activity: Insufficiently Active (01/24/2024)   Exercise Vital Sign    Days of Exercise per Week: 7 days    Minutes of Exercise per Session: 20 min  Stress: Stress Concern Present (01/24/2024)   [employer] of Occupational Health - Occupational Stress Questionnaire    Feeling of Stress: Rather much  Social Connections: Socially Isolated (01/24/2024)   Social Connection and Isolation Panel    Frequency of Communication with Friends and Family: More than three times a week    Frequency of Social Gatherings with Friends and Family: Once a week    Attends Religious Services: Never    Database administrator or Organizations: No    Attends Banker Meetings: Never    Marital Status: Widowed    Tobacco Counseling Ready to quit: Not Answered Counseling given: Not Answered    Clinical Intake:  Pre-visit preparation completed: Yes  Pain : No/denies pain     BMI - recorded: 26.95 Nutritional Status: BMI 25 -29 Overweight Nutritional Risks: None Diabetes: No  Lab Results  Component Value Date   HGBA1C 6.1 07/27/2023   HGBA1C 6.7 (H) 09/21/2020     How often do you need to have someone help you  when you read instructions, pamphlets, or other written materials from your doctor or pharmacy?: 1 - Never  Interpreter Needed?: No  Information entered by :: Juan Kent, RMA   Activities of Daily Living     01/24/2024   11:39 AM  In your present state of health, do you have any difficulty performing the following activities:  Hearing? 0  Vision? 0  Difficulty concentrating or making decisions? 0  Walking or climbing stairs? 0  Dressing or bathing? 0  Doing errands, shopping? 0  Preparing Food and eating ? N  Using the Toilet? N  In the past six months, have you accidently leaked urine? N  Do you have problems with loss of bowel control? N  Managing your Medications? N  Managing your Finances? N  Housekeeping or managing your Housekeeping? N    Patient Care Team: Jerrell Cleatus Ned, MD as PCP - General (Internal Medicine) Loni Soyla LABOR, MD as PCP -  Cardiology (Cardiology) Maree Lonni Inks, MD as Consulting Physician (Ophthalmology)  I have updated your Care Teams any recent Medical Services you may have received from other providers in the past year.     Assessment:   This is a routine wellness examination for Juan Kent.  Hearing/Vision screen Hearing Screening - Comments:: Denies hearing difficulties   Vision Screening - Comments:: Wears eyeglasses/[facility]/ [place]   Goals Addressed             This Visit's Progress    Patient Stated       Would like to quit smoking/2025       Depression Screen     01/24/2024   11:47 AM 12/27/2023   10:48 AM 10/26/2023   10:35 AM 07/27/2023   12:05 PM 10/09/2020   10:27 AM  PHQ 2/9 Scores  PHQ - 2 Score 1 2 0 2 0  PHQ- 9 Score 4 9 5 7  0    Fall Risk     01/24/2024   11:44 AM 07/27/2023   12:05 PM 10/09/2020   10:27 AM  Fall Risk   Falls in the past year? 0 0 0  Number falls in past yr: 0 0   Injury with Fall? 0 0   Risk for fall due to :  No Fall Risks No Fall Risks  Follow up Falls  evaluation completed;Falls prevention discussed Falls evaluation completed     MEDICARE RISK AT HOME:  Medicare Risk at Home Any stairs in or around the home?: Yes (outside in front) If so, are there any without handrails?: No Home free of loose throw rugs in walkways, pet beds, electrical cords, etc?: Yes Adequate lighting in your home to reduce risk of falls?: Yes Life alert?: No Use of a cane, walker or w/c?: No Grab bars in the bathroom?: No Shower chair or bench in shower?: No Elevated toilet seat or a handicapped toilet?: No  TIMED UP AND GO:  Was the test performed?  No  Cognitive Function: Declined/Normal: No cognitive concerns noted by patient or family. Patient alert, oriented, able to answer questions appropriately and recall recent events. No signs of memory loss or confusion.        Immunizations Immunization History  Administered Date(s) Administered   INFLUENZA, HIGH DOSE SEASONAL PF 12/27/2023   PFIZER(Purple Top)SARS-COV-2 Vaccination 09/06/2019    Screening Tests Health Maintenance  Topic Date Due   DTaP/Tdap/Td (1 - Tdap) Never done   Pneumococcal Vaccine: 50+ Years (1 of 2 - PCV) Never done   Zoster Vaccines- Shingrix (1 of 2) Never done   COVID-19 Vaccine (2 - Pfizer risk series) 09/27/2019   Colonoscopy  04/27/2024 (Originally 05/25/2001)   Medicare Annual Wellness (AWV)  01/23/2025   Influenza Vaccine  Completed   Hepatitis C Screening  Completed   Meningococcal B Vaccine  Aged Out    Health Maintenance Items Addressed: Referral sent to GI for colonoscopy, See Nurse Notes at the end of this note  Additional Screening:  Vision Screening: Recommended annual ophthalmology exams for early detection of glaucoma and other disorders of the eye. Is the patient up to date with their annual eye exam?  Yes  Who is the provider or what is the name of the office in which the patient attends annual eye exams? [facility] (located inside of [facility]) [place], [place]/per pt-up to date   Dental Screening: Recommended annual dental exams for proper oral hygiene  Community Resource Referral / Chronic  Care Management: CRR required this visit?  No   CCM required this visit?  No   Plan:    I have personally reviewed and noted the following in the patient's chart:   Medical and social history Use of alcohol, tobacco or illicit drugs  Current medications and supplements including opioid prescriptions. Patient is not currently taking opioid prescriptions. Functional ability and status Nutritional status Physical activity Advanced directives List of other physicians Hospitalizations, surgeries, and ER visits in previous 12 months Vitals Screenings to include cognitive, depression, and falls Referrals and appointments  In addition, I have reviewed and discussed with patient certain preventive protocols, quality metrics, and best practice recommendations. A written personalized care plan for preventive services as well as general preventive health recommendations were provided to patient.   Janyah Singleterry L Aman Bonet, CMA   01/24/2024   After Visit Summary: (Mail) Due to this being a telephonic visit, the after visit summary with patients personalized plan was offered to patient via mail   Notes: Patient declines all vaccines except flu.  He had no other concerns to address today.

## 2024-02-27 ENCOUNTER — Ambulatory Visit
Admission: EM | Admit: 2024-02-27 | Discharge: 2024-02-27 | Disposition: A | Discharge status: Home / Self Care | Attending: Nurse Practitioner | Admitting: Nurse Practitioner

## 2024-02-27 ENCOUNTER — Encounter: Payer: Self-pay | Admitting: Emergency Medicine

## 2024-02-27 DIAGNOSIS — J019 Acute sinusitis, unspecified: Secondary | ICD-10-CM

## 2024-02-27 DIAGNOSIS — B9689 Other specified bacterial agents as the cause of diseases classified elsewhere: Secondary | ICD-10-CM | POA: Diagnosis not present

## 2024-02-27 MED ORDER — AMOXICILLIN-POT CLAVULANATE 875-125 MG PO TABS: 1.0000 | ORAL_TABLET | Freq: Two times a day (BID) | ORAL | 0 refills | Status: AC

## 2024-02-27 NOTE — ED Triage Notes (Signed)
 Head congestion, sore throat, and cough x 1 week.  States symptoms have gotten worse over the past 3 days.  Has tried alka seltzer and nyquil without relief.

## 2024-02-27 NOTE — ED Provider Notes (Signed)
 [facility]    CSN: [id number] Arrival date & time: 02/27/24  1024      History   Chief Complaint No chief complaint on file.   HPI Juan Kent is a 68 y.o. male.   Patient presents today with more than 1 week history of congested, nonproductive cough, chest congestion, sore throat, frontal sinus pressure, headache, decreased appetite, and fatigue.  He denies fever, body aches or chills, shortness of breath or chest pain, abdominal pain, nausea/vomiting, and diarrhea.  No known sick contacts.  Reports he has been doing nasal lavages which helped temporarily.  Has also tried OTC meds without much improvement.    Past Medical History:  Diagnosis Date   Abnormal finding on imaging of liver    Hypertension    Non-ischemic cardiomyopathy (HCC)    Pulmonary hypertension (HCC)    Systolic heart failure (HCC)    Tobacco use disorder     Patient Active Problem List   Diagnosis Date Noted   Olecranon bursitis of right elbow 12/27/2023   Iron  deficiency 10/26/2023   Abdominal aortic aneurysm (AAA) without rupture 07/27/2023   Centrilobular emphysema (HCC) 07/27/2023   Prediabetes 07/27/2023   Pure hypercholesterolemia 07/27/2023   Generalized anxiety disorder 07/27/2023   Healthcare maintenance 10/10/2020   Tobacco use disorder 09/22/2020   Essential hypertension 09/22/2020   Chronic HFrEF (heart failure with reduced ejection fraction) (HCC) 09/21/2020    Past Surgical History:  Procedure Laterality Date   RIGHT/LEFT HEART CATH AND CORONARY ANGIOGRAPHY N/A 09/25/2020   Procedure: RIGHT/LEFT HEART CATH AND CORONARY ANGIOGRAPHY;  Surgeon: Claudene Victory ORN, MD;  Location: [facility];  Service: Cardiovascular;  Laterality: N/A;       Home Medications    Prior to Admission medications   Medication Sig Start Date End Date Taking? Authorizing Provider  amoxicillin -clavulanate (AUGMENTIN ) 875-125 MG tablet Take 1 tablet by mouth 2 (two) times daily for 7 days.  02/27/24 03/05/24 Yes Chandra Harlene LABOR, NP  atorvastatin  (LIPITOR) 20 MG tablet Take 1 tablet (20 mg total) by mouth daily. 07/27/23   Jerrell Cleatus Ned, MD  carvedilol  (COREG ) 25 MG tablet Take 1 tablet (25 mg total) by mouth 2 (two) times daily. 07/27/23   Jerrell Cleatus Ned, MD  cromolyn (OPTICROM) 4 % ophthalmic solution 1 drop 2 (two) times daily. 10/09/23   [provider]  cycloSPORINE (RESTASIS) 0.05 % ophthalmic emulsion 1 drop 2 (two) times daily. 10/10/23   [provider]  escitalopram  (LEXAPRO ) 10 MG tablet Take 1 tablet (10 mg total) by mouth daily. 10/30/23   Jerrell Cleatus Ned, MD  Iron , Ferrous Sulfate , 325 (65 Fe) MG TABS Take 325 mg by mouth daily. 10/30/23   Jerrell Cleatus Ned, MD  losartan -hydrochlorothiazide (HYZAAR) 100-25 MG tablet Take 1 tablet by mouth daily. 10/26/23   Jerrell Cleatus Ned, MD  spironolactone  (ALDACTONE ) 25 MG tablet Take 1 tablet (25 mg total) by mouth daily. 07/27/23   Jerrell Cleatus Ned, MD  tiotropium (SPIRIVA  HANDIHALER) 18 MCG inhalation capsule Place 1 capsule (18 mcg total) into inhaler and inhale daily. 12/27/23   Jerrell Cleatus Ned, MD    Family History Family History  Problem Relation Age of Onset   Cancer Father     Social History Social History   Tobacco Use   Smoking status: Every Day    Current packs/day: 1.00    Types: Cigarettes   Smokeless tobacco: Never  Vaping Use   Vaping status: Never Used  Substance Use Topics  Alcohol use: Not Currently   Drug use: Never     Allergies   Patient has no known allergies.   Review of Systems Review of Systems Per HPI  Physical Exam Triage Vital Signs ED Triage Vitals  Encounter Vitals Group     BP 02/27/24 1033 (!) 186/96     Girls Systolic BP Percentile --      Girls Diastolic BP Percentile --      Boys Systolic BP Percentile --      Boys Diastolic BP Percentile --      Pulse Rate 02/27/24 1033 70     Resp 02/27/24 1033 18      Temp 02/27/24 1033 98 F (36.7 C)     Temp Source 02/27/24 1033 Oral     SpO2 02/27/24 1033 92 %     Weight --      Height --      Head Circumference --      Peak Flow --      Pain Score 02/27/24 1031 0     Pain Loc --      Pain Education --      Exclude from Growth Chart --    No data found.  Updated Vital Signs BP (!) 186/96 (BP Location: Right Arm)   Pulse 70   Temp 98 F (36.7 C) (Oral)   Resp 18   SpO2 92%   Visual Acuity Right Eye Distance:   Left Eye Distance:   Bilateral Distance:    Right Eye Near:   Left Eye Near:    Bilateral Near:     Physical Exam Vitals and nursing note reviewed.  Constitutional:      General: He is not in acute distress.    Appearance: Normal appearance. He is not ill-appearing or toxic-appearing.  HENT:     Head: Normocephalic and atraumatic.     Right Ear: Tympanic membrane, ear canal and external ear normal.     Left Ear: Tympanic membrane, ear canal and external ear normal.     Nose: No congestion or rhinorrhea.     Right Sinus: Frontal sinus tenderness present.     Left Sinus: Frontal sinus tenderness present.     Mouth/Throat:     Mouth: Mucous membranes are moist.     Pharynx: Oropharynx is clear. No posterior oropharyngeal erythema.  Eyes:     General:        Right eye: No discharge.        Left eye: No discharge.     Extraocular Movements: Extraocular movements intact.     Pupils: Pupils are equal, round, and reactive to light.  Cardiovascular:     Rate and Rhythm: Normal rate and regular rhythm.  Pulmonary:     Effort: Pulmonary effort is normal. No respiratory distress.     Breath sounds: Normal breath sounds. No wheezing, rhonchi or rales.  Musculoskeletal:     Cervical back: Normal range of motion.  Lymphadenopathy:     Cervical: No cervical adenopathy.  Skin:    General: Skin is warm and dry.     Capillary Refill: Capillary refill takes less than 2 seconds.     Coloration: Skin is not jaundiced or pale.      Findings: No erythema.  Neurological:     Mental Status: He is alert and oriented to person, place, and time.  Psychiatric:        Behavior: Behavior is cooperative.      UC Treatments /  Results  Labs (all labs ordered are listed, but only abnormal results are displayed) Labs Reviewed - No data to display  EKG   Radiology No results found.  Procedures Procedures (including critical care time)  Medications Ordered in UC Medications - No data to display  Initial Impression / Assessment and Plan / UC Course  I have reviewed the triage vital signs and the nursing notes.  Pertinent labs & imaging results that were available during my care of the patient were reviewed by me and considered in my medical decision making (see chart for details).   Blood pressure is mildly elevated today, likely due to over-the-counter medication use.  Otherwise, vital signs are stable.  Patient is well-appearing.  Given length of symptoms and sinus tenderness to palpation, treat with Augmentin  twice daily for 7 days.  Other supportive care discussed, encouraged continued use of saline lavages, guaifenesin, hydration.  ER and return precautions discussed.  The patient was given the opportunity to ask questions.  All questions answered to their satisfaction.  The patient is in agreement to this plan.   Final Clinical Impressions(s) / UC Diagnoses   Final diagnoses:  Acute bacterial sinusitis     Discharge Instructions      You have a sinus infection.  Take the Augmentin  twice daily for 7 days as prescribed to treat it.  Continue nasal saline lavages and/or nasal saline spray as well as guaifenesin 600 mg twice daily to help loosen the congestion.  Be sure to stay plenty hydrated with water or sugar-free electrolyte solutions.  Seek care if symptoms do not improve with treatment.     ED Prescriptions     Medication Sig Dispense Auth. Provider   amoxicillin -clavulanate (AUGMENTIN ) 875-125 MG  tablet Take 1 tablet by mouth 2 (two) times daily for 7 days. 14 tablet Chandra Harlene LABOR, NP      PDMP not reviewed this encounter.   Chandra Harlene LABOR, NP 02/27/24 1154

## 2024-02-27 NOTE — Discharge Instructions (Signed)
 You have a sinus infection.  Take the Augmentin  twice daily for 7 days as prescribed to treat it.  Continue nasal saline lavages and/or nasal saline spray as well as guaifenesin 600 mg twice daily to help loosen the congestion.  Be sure to stay plenty hydrated with water or sugar-free electrolyte solutions.  Seek care if symptoms do not improve with treatment.

## 2024-03-07 ENCOUNTER — Telehealth: Payer: Self-pay

## 2024-03-07 NOTE — Telephone Encounter (Signed)
 I recommend using over-the-counter Affrin nasal spray for 3 days to help with the congestion over the weekend.  Continue with twice daily nasal irrigation along with Flonase.

## 2024-03-07 NOTE — Telephone Encounter (Signed)
 I do not recommend another treatments of antibiotics if the first was not helpful.  If the patient is still having significant sinus symptoms, would like to see him in the office for a visit.  Will need to talk more about his sinus hygiene, is he using sinus irrigation and intranasal steroids?  If he is in still having significant symptoms, next up would be a CT of the sinuses to look for structural problems.

## 2024-03-07 NOTE — Telephone Encounter (Signed)
 Would you like to see patient since she is still having sx?    Copied from CRM #[id number]. Topic: Clinical - Medical Advice >> Mar 06, 2024 11:39 AM Marylynn H wrote: Reason for CRM:  Patient states he went to [facility] on 11/25 - acute bacterial sinusitis. Was given a round of antibiotics and has finished them but is still experiencing symptoms. Patient is wanting to know if PCP can send in another round of antibiotics or something else to help out. Please advise # [phone]   amoxicillin -clavulanate (AUGMENTIN ) 875-125 MG tablet

## 2024-03-07 NOTE — Telephone Encounter (Signed)
 Called and discussed with patient, notes he is somewhat better but still having significant sxs, unfortunatly nothing open for tomorrow made appt for Monday pt would like to know if there is anything he can get for relief over the weekend.  Has been draining into his throat he is very hoarse, he has been doing nasal lavage, and has been taking mucinex and notes no fever

## 2024-03-08 NOTE — Telephone Encounter (Signed)
 Called patient and did relay this message and he voiced understanding

## 2024-03-11 ENCOUNTER — Ambulatory Visit: Admitting: Student in an Organized Health Care Education/Training Program

## 2024-03-11 ENCOUNTER — Encounter: Payer: Self-pay | Admitting: Student in an Organized Health Care Education/Training Program

## 2024-03-11 VITALS — BP 199/108 | HR 82 | Temp 98.2°F | Wt 171.0 lb

## 2024-03-11 DIAGNOSIS — I714 Abdominal aortic aneurysm, without rupture, unspecified: Secondary | ICD-10-CM | POA: Insufficient documentation

## 2024-03-11 DIAGNOSIS — J209 Acute bronchitis, unspecified: Secondary | ICD-10-CM

## 2024-03-11 DIAGNOSIS — I7409 Other arterial embolism and thrombosis of abdominal aorta: Secondary | ICD-10-CM

## 2024-03-11 DIAGNOSIS — I10 Essential (primary) hypertension: Secondary | ICD-10-CM

## 2024-03-11 DIAGNOSIS — B029 Zoster without complications: Secondary | ICD-10-CM | POA: Insufficient documentation

## 2024-03-11 DIAGNOSIS — B0229 Other postherpetic nervous system involvement: Secondary | ICD-10-CM | POA: Insufficient documentation

## 2024-03-11 MED ORDER — PREDNISONE 20 MG PO TABS: 40.0000 mg | ORAL_TABLET | Freq: Every day | ORAL | 0 refills | Status: AC

## 2024-03-11 MED ORDER — VALACYCLOVIR HCL 1 G PO TABS: 1000.0000 mg | ORAL_TABLET | Freq: Three times a day (TID) | ORAL | 0 refills | Status: AC

## 2024-03-11 MED ORDER — GUAIFENESIN-CODEINE 100-10 MG/5ML PO SOLN: 5.0000 mL | Freq: Every evening | ORAL | 0 refills | Status: DC | PRN

## 2024-03-11 NOTE — Assessment & Plan Note (Signed)
 He has chronic emphysema with acute bronchitis, presenting with a persistent cough and throat irritation likely due to a recent infection.  He had an element of acute sinusitis that was treated with 7 days of Augmentin  in late November.  Sinus issues seem to be resolving but still has inflammation from the pharynx through the bronchus.  Prescribe prednisone  40 mg for 5 days and cough syrup with codeine .

## 2024-03-11 NOTE — Assessment & Plan Note (Signed)
 We are a little overdue for surveillance ultrasound of his known abdominal aortic aneurysm, last imaged in 2024 at 3.5 cm.  It is still asymptomatic.  Still working on tobacco cessation.  I have ordered repeat abdominal ultrasound with Dopplers.

## 2024-03-11 NOTE — Assessment & Plan Note (Signed)
 He has an acute outbreak on his back with significant burning pain that started only 1 day ago.  Exam is most consistent with cutaneous zoster.  Early intervention is possible. Prescribe Valtrex  1 gram TID for 7 days and prednisone  40 mg for 5 days, extendable if pain persists.

## 2024-03-11 NOTE — Assessment & Plan Note (Signed)
 Chronic and poorly controlled today.  Still having issues with medication adherence. Hypertension management is critical, especially with prednisone  use. Continue Hyzaar, spironolactone , and carvedilol . Monitor blood pressure closely during prednisone  treatment.

## 2024-03-11 NOTE — Patient Instructions (Signed)
  VISIT SUMMARY: During your visit, we addressed your ongoing throat irritation, a suspected case of shingles, and your chronic conditions including emphysema, heart failure, and hypertension. We discussed your symptoms, reviewed your current medications, and made adjustments to your treatment plan to help manage your conditions more effectively.  YOUR PLAN: -HERPES ZOSTER (SHINGLES): Shingles is a viral infection that causes a painful rash, typically on one side of the body. You have an acute outbreak on your back with significant burning pain. We have prescribed Valtrex  1 gram three times a day for 7 days and prednisone  40 mg for 5 days to help manage the pain and symptoms. If the pain persists, the prednisone  treatment may be extended.  -EMPHYSEMA WITH ACUTE AND CHRONIC BRONCHITIS: Emphysema is a chronic lung condition that causes shortness of breath, and bronchitis is the inflammation of the bronchial tubes. You are experiencing a persistent cough and throat irritation likely due to a recent infection. We have prescribed prednisone  40 mg for 5 days and a cough syrup with codeine  to help alleviate your symptoms.  -CHRONIC HFREF (HEART FAILURE WITH REDUCED EJECTION FRACTION): Heart failure with reduced ejection fraction is a condition where the heart muscle does not pump blood as well as it should. There is no acute exacerbation at this time, so we will continue with your current management plan.  -ESSENTIAL HYPERTENSION: Hypertension is high blood pressure. It is important to manage your blood pressure, especially while taking prednisone . Continue taking Hyzaar, spironolactone , and carvedilol  as prescribed, and monitor your blood pressure closely during the prednisone  treatment.  INSTRUCTIONS: Please follow the prescribed medication regimen and monitor your blood pressure closely while taking prednisone . If your symptoms persist or worsen, or if you experience any new symptoms, please contact our  office. Schedule a follow-up appointment in one week to review your progress and make any necessary adjustments to your treatment plan.

## 2024-03-11 NOTE — Progress Notes (Signed)
 Acute Office Visit  Patient ID: Juan Kent, male    DOB: 1956-03-07, 68 y.o.   MRN: [id number]  PCP: Jerrell Cleatus Ned, MD  Chief Complaint  Patient presents with   Sinus Problem    UC 2 weeks ago and was stated bad sinus infection. Still having some concerns with throat. Rash on right side of abdomin and burns.     Subjective:     HPI  Discussed the use of AI scribe software for clinical note transcription with the patient, who gave verbal consent to proceed.  History of Present Illness Juan Kent is a 68 year old male who presents with a throat issue and suspected shingles.  He has been experiencing a persistent throat issue despite improvement in sinus symptoms. His sinuses have mostly cleared up with the use of Augmentin , which he took for seven days. However, he continues to have difficulty speaking and experiences irritation in his throat, particularly after coughing. Warm coffee provides some relief.  He suspects shingles, which began yesterday on his back, causing significant burning pain. The pain is described as 'burning like hell' and kept him awake last night. The rash is located on one side of his body.  He had a sinus infection diagnosed about two weeks ago at an urgent care, where his lungs were noted to be clear and he had no fever. He was prescribed Augmentin  for the sinus infection, which he completed. The drainage from the sinus infection was bothersome.  He has been experiencing a persistent cough, which is worse at night and sometimes leads to nausea. He has tried Mucinex  but found it minimally effective and has not taken it in the last few days. He also mentions using Abusadex and gargling as part of his self-care routine.  He is currently taking blood pressure medications including Hyzaar, spironolactone , and carvedilol . His blood pressure is controlled when at rest but increases with activity. No fever and his lungs were clear during a previous urgent care  visit.      Objective:    BP (!) 199/108   Pulse 82   Temp 98.2 F (36.8 C) (Oral)   Wt 171 lb (77.6 kg)   BMI 27.60 kg/m   Physical Exam  Gen: Tired appearing man Heart: Regular, no murmur Lungs: Coarse cough is moderate, lungs are clear to auscultation without crackles or wheezing, normal work of breathing Skin: On the right side of his lower back there is a erythematous vesicular rash with diffuse patches wrapping around his flank         Assessment & Plan:   Problem List Items Addressed This Visit       High   Essential hypertension (Chronic)   Chronic and poorly controlled today.  Still having issues with medication adherence. Hypertension management is critical, especially with prednisone  use. Continue Hyzaar, spironolactone , and carvedilol . Monitor blood pressure closely during prednisone  treatment.      Abdominal aortic aneurysm (AAA) 3.0 cm to 5.5 cm in diameter in male (Chronic)   We are a little overdue for surveillance ultrasound of his known abdominal aortic aneurysm, last imaged in 2024 at 3.5 cm.  It is still asymptomatic.  Still working on tobacco cessation.  I have ordered repeat abdominal ultrasound with Dopplers.      Relevant Orders   VAS US  AAA DUPLEX   Herpes zoster - Primary   He has an acute outbreak on his back with significant burning pain that started only 1 day ago.  Exam is most consistent with cutaneous zoster.  Early intervention is possible. Prescribe Valtrex  1 gram TID for 7 days and prednisone  40 mg for 5 days, extendable if pain persists.      Relevant Medications   valACYclovir  (VALTREX ) 1000 MG tablet   predniSONE  (DELTASONE ) 20 MG tablet     Medium    Emphysema with both acute and chronic bronchitis (HCC) (Chronic)   He has chronic emphysema with acute bronchitis, presenting with a persistent cough and throat irritation likely due to a recent infection.  He had an element of acute sinusitis that was treated with 7 days of  Augmentin  in late November.  Sinus issues seem to be resolving but still has inflammation from the pharynx through the bronchus.  Prescribe prednisone  40 mg for 5 days and cough syrup with codeine .      Relevant Medications   predniSONE  (DELTASONE ) 20 MG tablet   guaiFENesin -codeine  100-10 MG/5ML syrup    Meds ordered this encounter  Medications   valACYclovir  (VALTREX ) 1000 MG tablet    Sig: Take 1 tablet (1,000 mg total) by mouth 3 (three) times daily for 7 days.    Dispense:  21 tablet    Refill:  0   predniSONE  (DELTASONE ) 20 MG tablet    Sig: Take 2 tablets (40 mg total) by mouth daily with breakfast for 5 days.    Dispense:  10 tablet    Refill:  0   guaiFENesin -codeine  100-10 MG/5ML syrup    Sig: Take 5 mLs by mouth at bedtime as needed for cough.    Dispense:  120 mL    Refill:  0    Return if symptoms worsen or fail to improve.  Cleatus Debby Specking, MD [facility] at [facility]

## 2024-03-19 ENCOUNTER — Telehealth: Payer: Self-pay | Admitting: Student in an Organized Health Care Education/Training Program

## 2024-03-19 DIAGNOSIS — B029 Zoster without complications: Secondary | ICD-10-CM

## 2024-03-19 MED ORDER — GABAPENTIN 100 MG PO CAPS: 100.0000 mg | ORAL_CAPSULE | Freq: Three times a day (TID) | ORAL | 0 refills | Status: DC

## 2024-03-19 MED ORDER — LIDOCAINE 5 % EX PTCH: 1.0000 | MEDICATED_PATCH | CUTANEOUS | 0 refills | Status: AC

## 2024-03-19 NOTE — Telephone Encounter (Signed)
 Yes.  Ongoing discomfort 1 week after shingles that was treated with prednisone  and Valtrex  could represent ongoing shingles infection or early postherpetic neuralgia.  I have sent prescriptions for a lidocaine  patch that can be used once daily as well as low-dose gabapentin  that can be taken 1 tablet 3 times daily.  Patient will follow-up with me in about 2 weeks and I will check in on his discomfort at that visit.

## 2024-03-19 NOTE — Addendum Note (Signed)
 Addended by: JERRELL SOLIAN T on: 03/19/2024 01:09 PM   Modules accepted: Orders

## 2024-03-19 NOTE — Telephone Encounter (Signed)
 Patient stop by to inform Dr. Jerrell he still have shingles, his throat is sore, and hurts when he talks. He stated his medication finished can he get some more to help with the itching and burning.

## 2024-03-19 NOTE — Telephone Encounter (Signed)
Called patient and he verbalized understanding.

## 2024-03-20 ENCOUNTER — Telehealth: Payer: Self-pay

## 2024-03-20 ENCOUNTER — Other Ambulatory Visit (HOSPITAL_COMMUNITY): Payer: Self-pay

## 2024-03-20 NOTE — Telephone Encounter (Signed)
 Pharmacy Patient Advocate Encounter   Received notification from Physician's Office that prior authorization for Lidocaine  5% patches is required/requested.   Insurance verification completed.   The patient is insured through [facility] ADVANTAGE/RX ADVANCE.   Per test claim: PA required; PA submitted to above mentioned insurance via Latent Key/confirmation #/[id number] Status is pending

## 2024-03-20 NOTE — Telephone Encounter (Signed)
 Fax from [facility] pharmacy stating plan does not cover Lidocaine  5% patches.   Pharmacy is needing, can we try getting a PA for this patient ?

## 2024-03-20 NOTE — Telephone Encounter (Signed)
 Pharmacy Patient Advocate Encounter  Received notification from [facility] ADVANTAGE/RX ADVANCE that Prior Authorization for Lidocaine  5% patches has been APPROVED from 03/20/2024 to 03/20/2025. Ran test claim, Copay is $28.83. This test claim was processed through [facility]- copay amounts may vary at other pharmacies due to pharmacy/plan contracts, or as the patient moves through the different stages of their insurance plan.   PA #/Case ID/Reference #: [id number]

## 2024-03-21 NOTE — Telephone Encounter (Signed)
 Called patient and let him know this has been approved and he was appreciative of call.

## 2024-03-25 ENCOUNTER — Ambulatory Visit (HOSPITAL_COMMUNITY)
Admission: RE | Admit: 2024-03-25 | Discharge: 2024-03-25 | Disposition: A | Discharge status: Home / Self Care | Source: Ambulatory Visit | Attending: Student in an Organized Health Care Education/Training Program | Admitting: Student in an Organized Health Care Education/Training Program

## 2024-03-25 DIAGNOSIS — I714 Abdominal aortic aneurysm, without rupture, unspecified: Secondary | ICD-10-CM | POA: Insufficient documentation

## 2024-03-26 ENCOUNTER — Ambulatory Visit: Payer: Self-pay | Admitting: Student in an Organized Health Care Education/Training Program

## 2024-04-01 ENCOUNTER — Ambulatory Visit (INDEPENDENT_AMBULATORY_CARE_PROVIDER_SITE_OTHER): Admitting: Student in an Organized Health Care Education/Training Program

## 2024-04-01 ENCOUNTER — Encounter: Payer: Self-pay | Admitting: Student in an Organized Health Care Education/Training Program

## 2024-04-01 VITALS — BP 164/86 | HR 88 | Wt 168.0 lb

## 2024-04-01 DIAGNOSIS — F411 Generalized anxiety disorder: Secondary | ICD-10-CM

## 2024-04-01 DIAGNOSIS — H6120 Impacted cerumen, unspecified ear: Secondary | ICD-10-CM | POA: Insufficient documentation

## 2024-04-01 DIAGNOSIS — R490 Dysphonia: Secondary | ICD-10-CM | POA: Diagnosis not present

## 2024-04-01 DIAGNOSIS — H6122 Impacted cerumen, left ear: Secondary | ICD-10-CM

## 2024-04-01 DIAGNOSIS — B029 Zoster without complications: Secondary | ICD-10-CM | POA: Diagnosis not present

## 2024-04-01 MED ORDER — GABAPENTIN 300 MG PO CAPS: 300.0000 mg | ORAL_CAPSULE | Freq: Three times a day (TID) | ORAL | 1 refills | Status: DC

## 2024-04-01 NOTE — Assessment & Plan Note (Signed)
 Chronic issue of anxiety and depression.  Worsened over the last few weeks because of acute illness.  Struggling at home, his mother is sick as well.  He has been less functional lately which has been hard for him and his mother.  He recently restarted taking Lexapro , and I recommended continuing this medication to help support his mood.  Hopefully will see some improvements in his acute illnesses in the coming weeks.

## 2024-04-01 NOTE — Progress Notes (Signed)
 "  Established Patient Office Visit  Patient ID: Juan Kent, male    DOB: 1956/03/12  Age: 68 y.o. MRN: [id number] PCP: Jerrell Cleatus Ned, MD  Chief Complaint  Patient presents with   Medical Management of Chronic Issues    3 month follow up  Shingles and voice concerns     Subjective:     HPI  Discussed the use of AI scribe software for clinical note transcription with the patient, who gave verbal consent to proceed.  History of Present Illness Juan Kent is a 68 year old male who presents with persistent hoarseness following a sinus and viral infection.  He has experienced persistent hoarseness since a sinus and viral infection approximately one month ago. His voice is significantly more hoarse than before, although he notes some improvement. No significant congestion is present, but he experiences sinus drainage, particularly when lying down, leading to coughing. He is eager to regain his normal voice.  He reports ongoing pain from shingles, describing it as a sensation of 'rough sandpaper' on his side. The burning sensation has decreased, but he experiences significant back pain on the same side, which he attributes to muscle pain rather than bone or nerve pain. He has a history of back surgery and is currently taking gabapentin , initially at a dose of one tablet three times a day, but he has not been consistent with this regimen.  He mentions a history of ear wax buildup, which has affected his hearing. He recalls a previous experience where a syringe was used to clear the wax, which provided immediate relief.  He is currently taking medications for blood pressure and Lexapro . He acknowledges feeling depressed and lacking energy, which he attributes to his current health issues and the illness of his mother, with whom he lives. His mother has also been unwell recently, experiencing a cough and receiving treatment with steroids and Mucinex .     Objective:     BP (!) 164/86    Pulse 88   Wt 168 lb (76.2 kg)   SpO2 93%   BMI 27.12 kg/m   Physical Exam  Gen: Chronically ill-appearing man Ears: Ear canals are very narrow, right tympanic membrane appears normal, left ear canal is impacted with cerumen.  Attempted to remove this with a curette and irrigation.  Able to remove a moderate amount of wax but in the auto wax ball remains in the ear canal, patient did not tolerate full removal. Mouth: No oral lesions, cannot visualize the posterior oropharynx because of a high tongue base Neck: Normal thyroid , no nodules or adenopathy Heart: Regular, no murmur Lungs: Unlabored, clear throughout Skin: On his right flank he has a diffuse crusted erythematous rash consistent with recovering shingles    Assessment & Plan:   Problem List Items Addressed This Visit       High   Generalized anxiety disorder (Chronic)   Chronic issue of anxiety and depression.  Worsened over the last few weeks because of acute illness.  Struggling at home, his mother is sick as well.  He has been less functional lately which has been hard for him and his mother.  He recently restarted taking Lexapro , and I recommended continuing this medication to help support his mood.  Hopefully will see some improvements in his acute illnesses in the coming weeks.      Herpes zoster - Primary   Given time of postherpetic neuralgia on the right side of his lower back causing him significant discomfort.  We  treated his initial infection with prednisone  and Valtrex .  Everything is crusted over at this point.  No signs of disseminated disease.  He has significant neuropathic discomfort.  Tolerating gabapentin  100 mg well, will increase to 300 mg to take 2-3 times daily.  I recommended over-the-counter lidocaine  patches as well for the discomfort locally.      Relevant Medications   gabapentin  (NEURONTIN ) 300 MG capsule     Unprioritized   Hoarseness   Hoarse voice over the last few weeks after a upper  respiratory tract infection and acute sinusitis that was treated with antibiotics.  He has a history of tobacco use and at baseline had some amount of Renke edema.  His phonation is much more hoarse than usual now.  He still has symptoms of postnasal drip, so I think most likely this is related to the acute upper respiratory tract infection.  With his long tobacco use history we did talk about the risk of head and neck malignancy causing voice changes.  External exam of the head and neck looks normal with no signs of mass.  This was a bit overwhelming for him, we decided that if his voice does not improve in the coming weeks that we would refer him to ENT for laryngoscopy.      Cerumen impaction   Procedure Note: Manual Removal of Impacted Cerumen Using a Curette   Indication:  Cerumen impaction of the left ear causing symptoms (e.g., hearing loss, pain, tinnitus) or preventing assessment of the ear canal and tympanic membrane.  Procedure:  Explained the procedure to the patient and informed consent was obtained  Review patient history for contraindications (e.g., nonintact tympanic membrane, history of ear surgery, anatomical abnormalities)  After a position of the patient's head upright, I visualized the left ear canal and cerumen using an otoscope.  I gently inserted the curette into the ear canal avoiding contact with the canal walls, and carefully scooped the cerumen, removing it in small pieces.  I reassessed the ear canal and tympanic membrane, there was no sign of trauma.  There was residual impacting cerumen that he could not tolerate further removal.  Will have him use Debrox drops for softening over the next couple weeks and then return to clinic for another attempt at removal.  If that is not successful, will refer to ENT for suction removal.  Follow-Up:  I instructed the patient to report any persistent symptoms such as pain, discharge, or hearing loss.  Schedule a follow-up  appointment if necessary.        Cleatus Debby Specking, MD [facility] at [facility]   "

## 2024-04-01 NOTE — Assessment & Plan Note (Addendum)
 Procedure Note: Manual Removal of Impacted Cerumen Using a Curette   Indication:  Cerumen impaction of the left ear causing symptoms (e.g., hearing loss, pain, tinnitus) or preventing assessment of the ear canal and tympanic membrane.  Procedure:  Explained the procedure to the patient and informed consent was obtained  Review patient history for contraindications (e.g., nonintact tympanic membrane, history of ear surgery, anatomical abnormalities)  After a position of the patient's head upright, I visualized the left ear canal and cerumen using an otoscope.  I gently inserted the curette into the ear canal avoiding contact with the canal walls, and carefully scooped the cerumen, removing it in small pieces.  I reassessed the ear canal and tympanic membrane, there was no sign of trauma.  There was residual impacting cerumen that he could not tolerate further removal.  Will have him use Debrox drops for softening over the next couple weeks and then return to clinic for another attempt at removal.  If that is not successful, will refer to ENT for suction removal.  Follow-Up:  I instructed the patient to report any persistent symptoms such as pain, discharge, or hearing loss.  Schedule a follow-up appointment if necessary.

## 2024-04-01 NOTE — Assessment & Plan Note (Signed)
 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  We treated his initial infection with prednisone  and Valtrex .  Everything is crusted over at this point.  No signs of disseminated disease.  He has significant neuropathic discomfort.  Tolerating gabapentin  100 mg well, will increase to 300 mg to take 2-3 times daily.  I recommended over-the-counter lidocaine  patches as well for the discomfort locally.

## 2024-04-01 NOTE — Patient Instructions (Signed)
" °  VISIT SUMMARY: During your visit, we discussed several health concerns including persistent hoarseness, ongoing pain from shingles, ear wax buildup, depression, and blood pressure management. We reviewed your symptoms and adjusted your treatment plan to help manage these issues more effectively.  YOUR PLAN: -HERPES ZOSTER WITH NEUROPATHIC PAIN: Herpes zoster, also known as shingles, can cause long-lasting nerve pain. We will increase your gabapentin  dose to 300 mg, two to three times daily, and you can use over-the-counter lidocaine  4% patches for additional pain relief.  -POST-INFECTIOUS LARYNGEAL INFLAMMATION: Your hoarseness is likely due to inflammation of the voice box following a recent infection. We will monitor your symptoms for the next one to two weeks. If there is no improvement, we may refer you to an ENT specialist.  -CERUMEN IMPACTION, LEFT EAR: Ear wax buildup is causing hearing difficulties. We will use ear drops to soften the wax and then attempt to remove it through ear irrigation.  -GENERALIZED ANXIETY DISORDER AND DEPRESSION: You are experiencing depression and lack of energy, which may be worsened by your current health issues and living situation. Continue taking Lexapro  to help stabilize your mood.  -ESSENTIAL HYPERTENSION: Your high blood pressure is well-controlled with your current medications. Continue taking your antihypertensive medications as prescribed.  INSTRUCTIONS: Please follow up in two weeks to reassess your hoarseness and pain management. If your hoarseness does not improve, we may need to refer you to an ENT specialist. Continue taking your medications as directed and use the ear drops for ear wax buildup. If you have any new or worsening symptoms, please contact our office.  "

## 2024-04-01 NOTE — Assessment & Plan Note (Signed)
 Hoarse voice over the last few weeks after a upper respiratory tract infection and acute sinusitis that was treated with antibiotics.  He has a history of tobacco use and at baseline had some amount of Renke edema.  His phonation is much more hoarse than usual now.  He still has symptoms of postnasal drip, so I think most likely this is related to the acute upper respiratory tract infection.  With his long tobacco use history we did talk about the risk of head and neck malignancy causing voice changes.  External exam of the head and neck looks normal with no signs of mass.  This was a bit overwhelming for him, we decided that if his voice does not improve in the coming weeks that we would refer him to ENT for laryngoscopy.

## 2024-04-11 NOTE — Progress Notes (Signed)
 Juan Kent                                          MRN: [id number]   04/11/2024   The [facility] Quality Team Specialist reviewed this patient medical record for the purposes of chart review for care gap closure. The following were reviewed: chart review for care gap closure-controlling blood pressure.    [facility] Quality Team

## 2024-04-15 ENCOUNTER — Encounter: Payer: Self-pay | Admitting: Student in an Organized Health Care Education/Training Program

## 2024-04-15 ENCOUNTER — Ambulatory Visit: Admitting: Student in an Organized Health Care Education/Training Program

## 2024-04-15 VITALS — BP 122/73 | HR 76 | Temp 98.2°F | Ht 65.0 in | Wt 165.0 lb

## 2024-04-15 DIAGNOSIS — M545 Low back pain, unspecified: Secondary | ICD-10-CM | POA: Diagnosis not present

## 2024-04-15 DIAGNOSIS — R490 Dysphonia: Secondary | ICD-10-CM

## 2024-04-15 DIAGNOSIS — H6122 Impacted cerumen, left ear: Secondary | ICD-10-CM | POA: Diagnosis not present

## 2024-04-15 DIAGNOSIS — I10 Essential (primary) hypertension: Secondary | ICD-10-CM

## 2024-04-15 DIAGNOSIS — F17200 Nicotine dependence, unspecified, uncomplicated: Secondary | ICD-10-CM

## 2024-04-15 DIAGNOSIS — B0229 Other postherpetic nervous system involvement: Secondary | ICD-10-CM | POA: Diagnosis not present

## 2024-04-15 MED ORDER — TRAMADOL HCL 50 MG PO TABS: 50.0000 mg | ORAL_TABLET | Freq: Two times a day (BID) | ORAL | 0 refills | Status: AC | PRN

## 2024-04-15 NOTE — Assessment & Plan Note (Signed)
 He experiences persistent numbness and tingling on the right side of his back. Gabapentin  is effective for managing nerve pain. Continue gabapentin  300 mg orally twice daily.

## 2024-04-15 NOTE — Assessment & Plan Note (Signed)
 Chronic and stable.  Continues to use tobacco on a daily basis.  Not ready for cessation at this point.

## 2024-04-15 NOTE — Progress Notes (Signed)
 "  Established Patient Office Visit  Patient ID: Juan Kent, male    DOB: 1956-02-09  Age: 69 y.o. MRN: [id number] PCP: Jerrell Cleatus Ned, MD  Chief Complaint  Patient presents with   Follow-up    Patient states he is still not able to talk very well. Shingles are drying up states it looks like a bee sting. Isnt sure if it is causing back pain. Patient has multiple concerns to discuss.     Subjective:     HPI  Discussed the use of AI scribe software for clinical note transcription with the patient, who gave verbal consent to proceed.  History of Present Illness Juan Kent is a 69 year old male who presents with persistent pain and hoarseness.  He experiences ongoing pain from shingles, described as a 'bee sting' sensation with numbness and sensitivity. The burning sensation has decreased, but significant back pain persists, which he suspects may be related to shingles. He has not engaged in activities that might have strained his back. He reports weakness in his legs, particularly when moving from a kneeling position to standing, which has worsened recently. He has started taking potassium pills, which he believes have helped with cramps.  He has been experiencing hoarseness and a need to clear his throat, especially in the mornings due to drainage. His cough has improved significantly, although throat clearing persists. Hot coffee helps alleviate throat discomfort. Despite these symptoms, he continues to smoke.  He is currently taking gabapentin  twice daily, which helps with nerve pain associated with shingles, though it does not alleviate all types of pain. He also uses Tylenol  regularly for pain management. No significant side effects from gabapentin  are reported.  He mentions a history of earwax buildup, which has been problematic in the past, but he has not been using ear drops recently as he cannot find them.  Socially, he has not consumed alcohol for about ten years. He also  discusses his mother's high expectations for herself at the age of 75. No significant drowsiness or sleepiness from gabapentin .     Objective:     BP 122/73   Pulse 76   Temp 98.2 F (36.8 C) (Oral)   Ht 5' 5 (1.651 m)   Wt 165 lb (74.8 kg)   SpO2 98%   BMI 27.46 kg/m   Physical Exam  Gen: Well-appearing man Ears: Right ear is normal tympanic membrane, left ear impacted with cerumen, this was removed with a curette, left ear canal had moderate inflammation and small amount of bleeding, tympanic membrane was normal with no perforation Heart: Regular, 2 out of 6 early stock murmur at the right upper sternal bordern  Lungs: Unlabored, clear throughout with no crackles or wheezing Back: Some well-healing erythematous macules on his right flank consistent with recent zoster infection Neuro: Alert, conversational, full strength upper and lower extremities, normal reflexes in both patella, normal gait and balance    Assessment & Plan:   Problem List Items Addressed This Visit       High   Essential hypertension (Chronic)   Blood pressure well-controlled today at 122/73.  Plan to continue with Hyzaar and spironolactone .      Post herpetic neuralgia   He experiences persistent numbness and tingling on the right side of his back. Gabapentin  is effective for managing nerve pain. Continue gabapentin  300 mg orally twice daily.        Medium    Tobacco use disorder (Chronic)   Chronic and stable.  Continues  to use tobacco on a daily basis.  Not ready for cessation at this point.        Unprioritized   Hoarseness - Primary   He has intermittent hoarseness likely due to sinus inflammation and post-nasal drip. Although he has a smoking history, the current assessment suggests a sinus-related cause. Monitor hoarseness and consider an ENT referral if symptoms persist or worsen in 1-2 months.      Cerumen impaction   Procedure Note: Manual Removal of Impacted Cerumen Using a  Curette   Indication:  Cerumen impaction on the left causing symptoms (e.g., hearing loss, pain, tinnitus) or preventing assessment of the ear canal and tympanic membrane.  Procedure:  Explained the procedure to the patient and informed consent was obtained  Review patient history for contraindications (e.g., nonintact tympanic membrane, history of ear surgery, anatomical abnormalities).  After a position of the patient's head upright, I visualized the ear canal and cerumen using an otoscope.  I gently inserted the curette into the ear canal avoiding contact with the canal walls, and carefully scooped the cerumen, removing it in small pieces.  I reassessed the ear canal and tympanic membrane, there was no residual cerumen nor signs of trauma.  Follow-Up:  I instructed the patient to report any persistent symptoms such as pain, discharge, or hearing loss.  Schedule a follow-up appointment if necessary.       Acute low back pain   His low back pain has increased, possibly due to decreased activity or underlying arthritis, and is exacerbated by physical activity. Prescribe tramadol  for acute pain management on bad days. Advise against using tramadol  with heavy machinery or alcohol. Continue Tylenol  for pain and avoid daily ibuprofen.      Relevant Medications   traMADol  (ULTRAM ) 50 MG tablet    Return in about 3 months (around 07/14/2024).    Cleatus Debby Specking, MD [place] [facility]   "

## 2024-04-15 NOTE — Assessment & Plan Note (Signed)
 He has intermittent hoarseness likely due to sinus inflammation and post-nasal drip. Although he has a smoking history, the current assessment suggests a sinus-related cause. Monitor hoarseness and consider an ENT referral if symptoms persist or worsen in 1-2 months.

## 2024-04-15 NOTE — Assessment & Plan Note (Signed)
 Procedure Note: Manual Removal of Impacted Cerumen Using a Curette   Indication:  Cerumen impaction on the left causing symptoms (e.g., hearing loss, pain, tinnitus) or preventing assessment of the ear canal and tympanic membrane.  Procedure:  Explained the procedure to the patient and informed consent was obtained  Review patient history for contraindications (e.g., nonintact tympanic membrane, history of ear surgery, anatomical abnormalities).  After a position of the patient's head upright, I visualized the ear canal and cerumen using an otoscope.  I gently inserted the curette into the ear canal avoiding contact with the canal walls, and carefully scooped the cerumen, removing it in small pieces.  I reassessed the ear canal and tympanic membrane, there was no residual cerumen nor signs of trauma.  Follow-Up:  I instructed the patient to report any persistent symptoms such as pain, discharge, or hearing loss.  Schedule a follow-up appointment if necessary.

## 2024-04-15 NOTE — Patient Instructions (Signed)
" °  VISIT SUMMARY: During your visit, we discussed your persistent pain and hoarseness. We addressed your ongoing pain from shingles, low back pain, earwax buildup, and hoarseness. We also reviewed your current medications and made some adjustments to help manage your symptoms more effectively.  YOUR PLAN: -POSTHERPETIC NEURALGIA: Postherpetic neuralgia is nerve pain that occurs after a shingles infection. You will continue taking gabapentin  300 mg twice daily to manage this pain.  -LOW BACK PAIN: Your low back pain may be due to decreased activity or underlying arthritis. We have prescribed tramadol  for acute pain management on bad days. Please avoid using tramadol  with heavy machinery or alcohol. Continue taking Tylenol  for pain and avoid daily use of ibuprofen.  -CERUMEN IMPACTION: Cerumen impaction is a buildup of earwax that can cause discomfort. We manually removed the earwax, which resulted in some bleeding due to inflammation. This bleeding should resolve in 1-2 days.  -HOARSENESS: Your hoarseness is likely due to sinus inflammation and post-nasal drip. We will monitor your symptoms, and if they persist or worsen in 1-2 months, we may refer you to an ENT specialist.  INSTRUCTIONS: Please follow up if your symptoms persist or worsen. If your hoarseness does not improve in 1-2 months, we may need to refer you to an ENT specialist. Continue taking your medications as prescribed and avoid using tramadol  with heavy machinery or alcohol.    "

## 2024-04-15 NOTE — Assessment & Plan Note (Signed)
 Blood pressure well-controlled today at 122/73.  Plan to continue with Hyzaar and spironolactone .

## 2024-04-15 NOTE — Assessment & Plan Note (Signed)
 His low back pain has increased, possibly due to decreased activity or underlying arthritis, and is exacerbated by physical activity. Prescribe tramadol  for acute pain management on bad days. Advise against using tramadol  with heavy machinery or alcohol. Continue Tylenol  for pain and avoid daily ibuprofen.

## 2024-04-16 ENCOUNTER — Other Ambulatory Visit: Payer: Self-pay | Admitting: Student in an Organized Health Care Education/Training Program

## 2024-04-16 DIAGNOSIS — B029 Zoster without complications: Secondary | ICD-10-CM

## 2024-04-21 ENCOUNTER — Other Ambulatory Visit: Payer: Self-pay | Admitting: Student in an Organized Health Care Education/Training Program

## 2024-04-21 DIAGNOSIS — I5022 Chronic systolic (congestive) heart failure: Secondary | ICD-10-CM

## 2024-04-21 DIAGNOSIS — I25118 Atherosclerotic heart disease of native coronary artery with other forms of angina pectoris: Secondary | ICD-10-CM

## 2024-05-04 ENCOUNTER — Other Ambulatory Visit: Payer: Self-pay | Admitting: Student in an Organized Health Care Education/Training Program

## 2024-05-04 DIAGNOSIS — B029 Zoster without complications: Secondary | ICD-10-CM

## 2024-07-15 ENCOUNTER — Ambulatory Visit: Admitting: Student in an Organized Health Care Education/Training Program
